# Patient Record
Sex: Male | Born: 1975 | Race: White | Hispanic: No | Marital: Married | Smoking: Never smoker
Health system: Southern US, Community
[De-identification: ages and names within clinical notes are randomized; demographics above are authoritative.]

## PROBLEM LIST (undated history)

## (undated) DIAGNOSIS — T8859XA Other complications of anesthesia, initial encounter: Secondary | ICD-10-CM

## (undated) DIAGNOSIS — I10 Essential (primary) hypertension: Secondary | ICD-10-CM

## (undated) HISTORY — PX: KNEE ARTHROSCOPY: SHX127

## (undated) HISTORY — PX: KNEE ARTHROSCOPY W/ ACL RECONSTRUCTION: SHX1858

---

## 2015-10-19 ENCOUNTER — Encounter: Payer: Self-pay | Admitting: *Deleted

## 2015-10-19 ENCOUNTER — Emergency Department
Admission: EM | Admit: 2015-10-19 | Discharge: 2015-10-19 | Disposition: A | Discharge status: Home / Self Care | Payer: BLUE CROSS/BLUE SHIELD | Source: Home / Self Care

## 2015-10-19 DIAGNOSIS — Z111 Encounter for screening for respiratory tuberculosis: Secondary | ICD-10-CM

## 2015-10-19 MED ORDER — TUBERCULIN PPD 5 UNIT/0.1ML ID SOLN
5.0000 [IU] | Freq: Once | INTRADERMAL | Status: DC
  Administered 2015-10-19: 5 [IU] via INTRADERMAL

## 2015-10-19 NOTE — ED Notes (Signed)
Pt is here for PPD placement. Placed to [facility]. Denies unexplained sweats, fever, SOB, cough or CP. Advised of when to return for PPD read. He may have PPD read elsewhere, I made a copy for him to take, kept original.

## 2015-10-21 ENCOUNTER — Emergency Department
Admission: EM | Admit: 2015-10-21 | Discharge: 2015-10-21 | Disposition: A | Discharge status: Home / Self Care | Payer: BLUE CROSS/BLUE SHIELD | Source: Home / Self Care

## 2015-10-21 LAB — READ PPD: TB SKIN TEST: NEGATIVE

## 2015-10-21 NOTE — ED Notes (Signed)
Pt is here for PPD reading placed in [facility] on 10/19/15. Negative for induration.

## 2015-10-27 ENCOUNTER — Encounter: Payer: Self-pay | Admitting: Family Medicine

## 2015-10-27 ENCOUNTER — Ambulatory Visit (INDEPENDENT_AMBULATORY_CARE_PROVIDER_SITE_OTHER): Payer: BLUE CROSS/BLUE SHIELD | Admitting: Family Medicine

## 2015-10-27 VITALS — BP 139/76 | HR 84 | Ht 68.0 in | Wt 174.0 lb

## 2015-10-27 DIAGNOSIS — G4726 Circadian rhythm sleep disorder, shift work type: Secondary | ICD-10-CM | POA: Diagnosis not present

## 2015-10-27 DIAGNOSIS — Z0000 Encounter for general adult medical examination without abnormal findings: Secondary | ICD-10-CM

## 2015-10-27 DIAGNOSIS — B360 Pityriasis versicolor: Secondary | ICD-10-CM | POA: Insufficient documentation

## 2015-10-27 DIAGNOSIS — IMO0001 Reserved for inherently not codable concepts without codable children: Secondary | ICD-10-CM | POA: Insufficient documentation

## 2015-10-27 MED ORDER — FLUCONAZOLE 100 MG PO TABS: 300.0000 mg | ORAL_TABLET | ORAL | Status: DC

## 2015-10-27 MED ORDER — MODAFINIL 200 MG PO TABS: 200.0000 mg | ORAL_TABLET | Freq: Every day | ORAL | Status: DC | PRN

## 2015-10-27 MED ORDER — KETOCONAZOLE 2 % EX CREA: 1.0000 "application " | TOPICAL_CREAM | Freq: Two times a day (BID) | CUTANEOUS | Status: DC

## 2015-10-27 NOTE — Patient Instructions (Signed)
Thank you for coming in today. Get fasting labs soon.  Take fluconazole 3 pills once weekly for 2 weeks and use ketoconazole cream  Tinea Versicolor Tinea versicolor is a common fungal infection of the skin. It causes a rash that appears as light or dark patches on the skin. The rash most often occurs on the chest, back, neck, or upper arms. This condition is more common during warm weather. Other than affecting how your skin looks, tinea versicolor usually does not cause other problems. In most cases, the infection goes away in a few weeks with treatment. It may take a few months for the patches on your skin to clear up. CAUSES Tinea versicolor occurs when a type of fungus that is normally present on the skin starts to overgrow. This fungus is a kind of yeast. The exact cause of the overgrowth is not known. This condition cannot be passed from one person to another (noncontagious). RISK FACTORS This condition is more likely to develop when certain factors are present, such as:  Heat and humidity.  Sweating too much.  Hormone changes.  Oily skin.  A weak defense (immune) system. SYMPTOMS Symptoms of this condition may include:  A rash on your skin that is made up of light or dark patches. The rash may have:  Patches of tan or pink spots on light skin.  Patches of white or brown spots on dark skin.  Patches of skin that do not tan.  Well-marked edges.  Scales on the discolored areas.  Mild itching. DIAGNOSIS A health care provider can usually diagnose this condition by looking at your skin. During the exam, he or she may use ultraviolet light to help determine the extent of the infection. In some cases, a skin sample may be taken by scraping the rash. This sample will be viewed under a microscope to check for yeast overgrowth. TREATMENT Treatment for this condition may include:  Dandruff shampoo that is applied to the affected skin during showers or  bathing.  Over-the-counter medicated skin cream, lotion, or soaps.  Prescription antifungal medicine in the form of skin cream or pills.  Medicine to help reduce itching. HOME CARE INSTRUCTIONS  Take medicines only as directed by your health care provider.  Apply dandruff shampoo to the affected area if told to do so by your health care provider. You may be instructed to scrub the affected skin for several minutes each day.  Do not scratch the affected area of skin.  Avoid hot and humid conditions.  Do not use tanning booths.  Try to avoid sweating a lot. SEEK MEDICAL CARE IF:  Your symptoms get worse.  You have a fever.  You have redness, swelling, or pain at the site of your rash.  You have fluid, blood, or pus coming from your rash.  Your rash returns after treatment.   This information is not intended to replace advice given to you by your health care provider. Make sure you discuss any questions you have with your health care provider.   Document Released: 04/21/2000 Document Revised: 05/15/2014 Document Reviewed: 02/03/2014 Elsevier Interactive Patient Education [employer]2016 Elsevier Inc.

## 2015-10-27 NOTE — Progress Notes (Signed)
Jesus Baker is a 40 y.o. 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   Patient is doing well and is healthy. He essentially has no chronic medical problems aside from occasional. Work sleep disorder and tinea versicolor. He takes several over-the-counter supplements and feels very well. He exercises regularly and tries to maintain a healthy lifestyle. He does not smoke or drink in excess or use smokeless tobacco. No fevers or chills.   Tinea versicolor: On back during the summer. Patient typically uses ketoconazole cream he would like a refill.  Shiftwork sleep disorder: Using modafinil occasionally. He would like a refill as well. This helped a great deal.  No past medical history on file. Past Surgical History  Procedure Laterality Date  . Knee arthroscopy w/ acl reconstruction    . Knee arthroscopy     Social History  Substance Use Topics  . Smoking status: Never Smoker   . Smokeless tobacco: Never Used  . Alcohol Use: 1.8 oz/week    3 Standard drinks or equivalent per week   family history includes Cancer in his maternal grandfather; Hyperlipidemia in his father; Hypertension in his mother; Stroke in his maternal grandfather.  ROS as above: No headache, visual changes, nausea, vomiting, diarrhea, constipation, dizziness, abdominal pain, , fevers, chills, night sweats, weight loss, swollen lymph nodes, body aches, joint swelling, muscle aches, chest pain, shortness of breath, mood changes, visual or auditory hallucinations.   Medications: Current Outpatient Prescriptions  Medication Sig Dispense Refill  . Cholecalciferol (VITAMIN D-3 PO) Take 1 capsule by mouth daily.    Marland Kitchen. MAGNESIUM PO Take by mouth.    . modafinil (PROVIGIL) 200 MG tablet Take 1 tablet (200 mg total) by mouth daily as  needed. 30 tablet 1  . Multiple Vitamin (MULTIVITAMIN) capsule Take 1 capsule by mouth daily.    . Nutritional Supplements (CALCIUM D-GLUCARATE PO) Take 1 capsule by mouth daily.    . Omega-3 Fatty Acids (FISH OIL) 1000 MG CAPS Take 1 capsule by mouth daily.    . sodium bicarbonate 325 MG tablet Take 325 mg by mouth daily.    . fluconazole (DIFLUCAN) 100 MG tablet Take 3 tablets (300 mg total) by mouth once a week. 6 tablet 0  . ketoconazole (NIZORAL) 2 % cream Apply 1 application topically 2 (two) times daily. To affected areas. 60 g 3   No current facility-administered medications for this visit.   Allergies  Allergen Reactions  . Poison Ivy Extract [employer][Extract Of Poison Ivy]   . Poison Oak Extract [employer][Extract Of Poison Oak]   . Sumac      Exam:  BP 139/76 mmHg  Pulse 84  Ht 5\' 8"  (1.727 m)  Wt 174 lb (78.926 kg)  BMI 26.46 kg/m2  SpO2 99% Gen: Well NAD HEENT: EOMI,  MMM Lungs: Normal work of breathing. CTABL Heart: RRR no MRG Abd: NABS, Soft. Nondistended, Nontender Exts: Brisk capillary refill, warm and well perfused.  Skin: Hypopigmented macular circular lesions on trunk   No results found for this or any previous visit (from the past 24 hour(s)). No results found.    Assessment and Plan: 40 y.o. male with  Well visit: Doing well. Up-to-date health maintenance. Check basic fasting labs.   maintain healthy lifestyle.  Prescribed fluconazole and topical ketoconazole for tinea versicolor. Refill modafinil  Recheck in 6-12 months.  Discussed warning signs or  symptoms. Please see discharge instructions. Patient expresses understanding.

## 2015-12-24 ENCOUNTER — Ambulatory Visit (INDEPENDENT_AMBULATORY_CARE_PROVIDER_SITE_OTHER): Payer: BLUE CROSS/BLUE SHIELD | Admitting: Family Medicine

## 2015-12-24 DIAGNOSIS — Z23 Encounter for immunization: Secondary | ICD-10-CM

## 2015-12-29 NOTE — Progress Notes (Signed)
Flu vaccine given.

## 2016-02-18 ENCOUNTER — Ambulatory Visit (INDEPENDENT_AMBULATORY_CARE_PROVIDER_SITE_OTHER): Payer: BLUE CROSS/BLUE SHIELD | Admitting: Family Medicine

## 2016-02-18 ENCOUNTER — Encounter: Payer: Self-pay | Admitting: Family Medicine

## 2016-02-18 DIAGNOSIS — M79632 Pain in left forearm: Secondary | ICD-10-CM | POA: Diagnosis not present

## 2016-02-18 NOTE — Patient Instructions (Addendum)
Thank you for coming in today. Do the exercises we discussed.  Modify lifting.  I am concerned about Radial Tunnel Syndrome.   Return in 1 month if not better.    Radial Tunnel Syndrome With Rehab Radial tunnel syndrome is a condition of the nervous system in which the radial nerve is compressed by surrounding structures in the elbow or forearm. Weakness in the hand and wrist characterizes the condition. The particular branch of the medial nerve that is usually affected (posterior interosseous branch) does not include sensory nerve cells; therefore, this condition does not usually involve severe pain or numbness. SYMPTOMS   Diffuse pain in the forearm and hand during activity.  Decreased grip and forearm strength.  Outer (lateral) elbow tenderness.  Pain that worsens when rotating the wrist (using a screwdriver or opening a door). CAUSES  An increased pressure placed on the radial nerve causes radial tunnel syndrome. The compression usually occurs in the elbow or forearm by muscles and ligament-like tissue known as the interosseous membrane. The condition may also be caused by direct trauma to the elbow or forearm. RISK INCREASES WITH:  Activities that involve repetitive and/or strenuous wrist and forearm movements (tennis or carpentry).  Contact sports (football, soccer, lacrosse or rugby).  Poor strength and flexibility  Failure to warm-up properly before activity.  Diabetes mellitus.  Underactive thyroid gland (hypothyroidism ). PREVENTION   Warm up and stretch properly before activity.  Maintain physical fitness:  Strength, flexibility, and endurance.  Cardiovascular fitness.  Wear properly fitted and padded protective equipment (elbow pads and slash guards). PROGNOSIS  If treated properly, then the symptoms of radial tunnel syndrome typically resolve. Rarely, surgery is necessary to free the compressed nerve.  RELATED COMPLICATIONS   Permanent nerve damage that  results in paralysis or weakness of the forearm and hand.  Prolonged healing time, if improperly treated or re-injured.  Prolonged disability (uncommon). TREATMENT  Treatment initially involves resting from any activities that aggravate the symptoms. Ice and medications may be used to help reduce pain and inflammation. The use of strengthening and stretching exercises may help reduce pain with activity. These exercises may be performed at home or with referral to a therapist. If there are signs of muscle wasting (atrophy) or symptoms persist for greater than 6 months despite conservative (non-surgical) treatment, then surgery may be recommended. MEDICATION   If pain medication is necessary, then nonsteroidal anti-inflammatory medications, such as aspirin and ibuprofen, or other minor pain relievers, such as acetaminophen, are often recommended.  Do not take pain medication for 7 days before surgery.  Prescription pain relievers may be given if deemed necessary by your caregiver. Use only as directed and only as much as you need. HEAT AND COLD  Cold treatment (icing) relieves pain and reduces inflammation. Cold treatment should be applied for 10 to 15 minutes every 2 to 3 hours for inflammation and pain and immediately after any activity that aggravates your symptoms. Use ice packs or massage the area with a piece of ice (ice massage).  Heat treatment may be used prior to performing the stretching and strengthening activities prescribed by your caregiver, physical therapist, or athletic trainer. Use a heat pack or soak the injury in warm water. SEEK MEDICAL CARE IF:  Treatment seems to offer no benefit, or the condition worsens.  Any medications produce adverse side effects.  Any complications from surgery occur:  Pain, numbness, or coldness in the extremity operated upon.  Discoloration of the nail beds (they become blue  or gray) of the extremity operated upon.  Signs of infections  (fever, pain, inflammation, redness, or persistent bleeding). EXERCISES  RANGE OF MOTION (ROM) AND STRETCHING EXERCISES - Radial Tunnel Syndrome (Radial [Posterior Interosseous] Nerve) These exercises may help you when beginning to rehabilitate your injury. Your symptoms may resolve with or without further involvement from your physician, physical therapist or athletic trainer. While completing these exercises, remember:   Restoring tissue flexibility helps normal motion to return to the joints. This allows healthier, less painful movement and activity.  An effective stretch should be held for at least 30 seconds.  A stretch should never be painful. You should only feel a gentle lengthening or release in the stretched tissue. RANGE OF MOTION - Wrist Flexion, Active-Assisted  Extend your right / left elbow with your fingers pointing down.*  Gently pull the back of your hand towards you until you feel a gentle stretch on the top of your forearm.  Hold this position for __________ seconds. Repeat __________ times. Complete this exercise __________ times per day.  *If directed by your physician, physical therapist or athletic trainer, complete this stretch with your elbow bent rather than extended. STRETCH - Wrist Flexion  Place the back of your right / left hand on a tabletop leaving your elbow slightly bent. Your fingers should point away from your body.  Gently press the back of your hand down onto the table by straightening your elbow. You should feel a stretch on the top of your forearm.  Hold this position for __________ seconds. Repeat __________ times. Complete this stretch __________ times per day.  STRENGTHENING EXERCISES - Radial Tunnel Syndrome (Radial [Posterior Interosseous] Nerve) These exercises may help you when beginning to rehabilitate your injury. They may resolve your symptoms with or without further involvement from your physician, physical therapist or athletic trainer.  While completing these exercises, remember:   Muscles can gain both the endurance and the strength needed for everyday activities through controlled exercises.  Complete these exercises as instructed by your physician, physical therapist or athletic trainer. Progress the resistance and repetitions only as guided. STRENGTH - Wrist Extensors  Sit with your right / left forearm palm-down and fully supported. Your elbow should be resting below the height of your shoulder. Allow your wrist to extend over the edge of the surface.  Loosely holding a __________ weight or a piece of rubber exercise band/tubing, slowly curl your hand up toward your forearm.  Hold this position for __________ seconds. Slowly lower the wrist back to the starting position in a controlled manner. Repeat __________ times. Complete this exercise __________ times per day.  STRENGTH - Radial Deviators  Stand with a ____________________ weight in your right / left hand, or sit holding on to the rubber exercise band/tubing with your arm supported.  Raise your hand upward in front of you or pull up on the rubber tubing.  Hold this position for __________ seconds and then slowly lower the wrist back to the starting position. Repeat __________ times. Complete this exercise __________ times per day. STRENGTH - Grip   Grasp a tennis ball, a dense sponge, or a large, rolled sock in your hand.  Squeeze as hard as you can without increasing any pain.  Hold this position for __________ seconds. Release your grip slowly. Repeat __________ times. Complete this exercise __________ times per day.    This information is not intended to replace advice given to you by your health care provider. Make sure you discuss any  questions you have with your health care provider.   Document Released: 04/24/2005 Document Revised: 09/08/2014 Document Reviewed: 08/06/2008 Elsevier Interactive Patient Education [employer] Elsevier Inc.

## 2016-02-18 NOTE — Progress Notes (Signed)
   Jesus Baker is a 40 y.o. male who presents to [facility] today for left forearm pain. Patient has a several month history of intermittent mild left forearm pain. Pain is located in the lateral forearm worst with grip and wrist extension. He denies any elbow pain radiating pain weakness or numbness. He denies any injury. The pain is mild at rest but worsens with activity. He notes he's had a cut back on weightlifting. Additionally he has some pain when he takes his daughter up. No fevers or chills. No treatment tried yet.   No past medical history on file. Past Surgical History:  Procedure Laterality Date  . KNEE ARTHROSCOPY    . KNEE ARTHROSCOPY W/ ACL RECONSTRUCTION     Social History  Substance Use Topics  . Smoking status: Never Smoker  . Smokeless tobacco: Never Used  . Alcohol use 1.8 oz/week    3 Standard drinks or equivalent per week     ROS:  As above   Medications: Current Outpatient Prescriptions  Medication Sig Dispense Refill  . Cholecalciferol (VITAMIN D-3 PO) Take 1 capsule by mouth daily.    Marland Kitchen. ketoconazole (NIZORAL) 2 % cream Apply 1 application topically 2 (two) times daily. To affected areas. 60 g 3  . MAGNESIUM PO Take by mouth.    . modafinil (PROVIGIL) 200 MG tablet Take 1 tablet (200 mg total) by mouth daily as needed. 30 tablet 1  . Multiple Vitamin (MULTIVITAMIN) capsule Take 1 capsule by mouth daily.    . Nutritional Supplements (CALCIUM D-GLUCARATE PO) Take 1 capsule by mouth daily.    . Omega-3 Fatty Acids (FISH OIL) 1000 MG CAPS Take 1 capsule by mouth daily.    . sodium bicarbonate 325 MG tablet Take 325 mg by mouth daily.    . fluconazole (DIFLUCAN) 100 MG tablet Take 3 tablets (300 mg total) by mouth once a week. (Patient not taking: Reported on 02/18/2016) 6 tablet 0   No current facility-administered medications for this visit.    Allergies  Allergen Reactions  . Poison Ivy Extract [Poison Ivy  Extract]   . Poison Oak Extract [employer][Poison Oak Extract]   . Sumac      Exam:  BP (!) 128/92   Pulse 63   Wt 175 lb (79.4 kg)   BMI 26.61 kg/m  General: Well Developed, well nourished, and in no acute distress.  Neuro/Psych: Alert and oriented x3, extra-ocular muscles intact, able to move all 4 extremities, sensation grossly intact. Skin: Warm and dry, no rashes noted.  Respiratory: Not using accessory muscles, speaking in full sentences, trachea midline.  Cardiovascular: Pulses palpable, no extremity edema. Abdomen: Does not appear distended. MSK: Left forearm is normal-appearing. The lateral epicondyle is nontender. The anterior lateral forearm is mildly tender. Intact wrist and finger extension and flexion strength. Pulses capillary refill and sensation are intact. Elbow flexion and extension wrist flexion and extension and supination and pronation are normal.    No results found for this or any previous visit (from the past 48 hour(s)). No results found.    Assessment and Plan: 40 y.o. 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 Plan for modification of lifting and eccentric wrist exercises. If no better in one month will return for ultrasound guided injection of the arcade de frohse.   No orders of the defined types were placed in this encounter.   Discussed warning signs or symptoms. Please see discharge instructions. Patient expresses understanding.

## 2016-03-17 ENCOUNTER — Encounter: Payer: Self-pay | Admitting: Family Medicine

## 2016-03-17 ENCOUNTER — Ambulatory Visit (INDEPENDENT_AMBULATORY_CARE_PROVIDER_SITE_OTHER): Payer: BLUE CROSS/BLUE SHIELD | Admitting: Family Medicine

## 2016-03-17 VITALS — BP 144/80 | HR 60 | Ht 68.0 in | Wt 170.0 lb

## 2016-03-17 DIAGNOSIS — M79632 Pain in left forearm: Secondary | ICD-10-CM | POA: Diagnosis not present

## 2016-03-17 NOTE — Progress Notes (Signed)
   Jesus Baker is a 40 y.o. male who [facility] today for follow-up left forearm pain. Patient was seen a month ago for left forearm pain thought to be radial tunnel syndrome. He's tried modifying his activities doesn't feel any better. He continues to note diffuse soreness in his left forearm but denies any weakness or numbness or loss of function. No fevers or chills.   No past medical history on file. Past Surgical History:  Procedure Laterality Date  . KNEE ARTHROSCOPY    . KNEE ARTHROSCOPY W/ ACL RECONSTRUCTION     Social History  Substance Use Topics  . Smoking status: Never Smoker  . Smokeless tobacco: Never Used  . Alcohol use 1.8 oz/week    3 Standard drinks or equivalent per week     ROS:  As above   Medications: Current Outpatient Prescriptions  Medication Sig Dispense Refill  . Cholecalciferol (VITAMIN D-3 PO) Take 1 capsule by mouth daily.    Marland Kitchen. MAGNESIUM PO Take by mouth.    . modafinil (PROVIGIL) 200 MG tablet Take 1 tablet (200 mg total) by mouth daily as needed. 30 tablet 1  . Multiple Vitamin (MULTIVITAMIN) capsule Take 1 capsule by mouth daily.    . Nutritional Supplements (CALCIUM D-GLUCARATE PO) Take 1 capsule by mouth daily.    . Omega-3 Fatty Acids (FISH OIL) 1000 MG CAPS Take 1 capsule by mouth daily.    . sodium bicarbonate 325 MG tablet Take 325 mg by mouth daily.     No current facility-administered medications for this visit.    Allergies  Allergen Reactions  . Poison Ivy Extract [Poison Ivy Extract]   . Poison Oak Extract [employer][Poison Oak Extract]   . Sumac      Exam:  BP (!) 144/80   Pulse 60   Ht 5\' 8"  (1.727 m)   Wt 170 lb (77.1 kg)   BMI 25.85 kg/m  General: Well Developed, well nourished, and in no acute distress.  Neuro/Psych: Alert and oriented x3, extra-ocular muscles intact, able to move all 4 extremities, sensation grossly intact. Skin: Warm and dry, no rashes noted.    Respiratory: Not using accessory muscles, speaking in full sentences, trachea midline.  Cardiovascular: Pulses palpable, no extremity edema. Abdomen: Does not appear distended. MSK: Left arm is well-appearing. Minimally tender left lateral forearm. Normal elbow and wrist motion. Intact sensation strength pulses capillary refill. Reproducible with resisted supination.  Limited musculoskeletal ultrasound of the radial nerve near the arcade de frohse shows the radial nerve slightly enlarged at 0.23 cm cross-sectional area. Pain is somewhat reproducible with compression of this area. Normal appearance of bony structures.   No results found for this or any previous visit (from the past 48 hour(s)). No results found.    Assessment and Plan: 40 y.o. 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 Plan to treat with formal hand therapy. If not better would consider injection. Recheck in 1-2 months.    Orders Placed This Encounter  Procedures  . Ambulatory referral to Physical Therapy    Referral Priority:   Routine    Referral Type:   Physical Medicine    Referral Reason:   Specialty Services Required    Requested Specialty:   Physical Therapy    Number of Visits Requested:   1    Discussed warning signs or symptoms. Please see discharge instructions. Patient expresses understanding.

## 2016-03-17 NOTE — Patient Instructions (Signed)
Thank you for coming in today. Attend Hand PT.  Recheck in 1-2 months. } Return sooner if needed.   Let me know if you worsen.    Radial Tunnel Syndrome With Rehab Radial tunnel syndrome is a condition of the nervous system in which the radial nerve is compressed by surrounding structures in the elbow or forearm. Weakness in the hand and wrist characterizes the condition. The particular branch of the medial nerve that is usually affected (posterior interosseous branch) does not include sensory nerve cells; therefore, this condition does not usually involve severe pain or numbness. SYMPTOMS   Diffuse pain in the forearm and hand during activity.  Decreased grip and forearm strength.  Outer (lateral) elbow tenderness.  Pain that worsens when rotating the wrist (using a screwdriver or opening a door). CAUSES  An increased pressure placed on the radial nerve causes radial tunnel syndrome. The compression usually occurs in the elbow or forearm by muscles and ligament-like tissue known as the interosseous membrane. The condition may also be caused by direct trauma to the elbow or forearm. RISK INCREASES WITH:  Activities that involve repetitive and/or strenuous wrist and forearm movements (tennis or carpentry).  Contact sports (football, soccer, lacrosse or rugby).  Poor strength and flexibility  Failure to warm-up properly before activity.  Diabetes mellitus.  Underactive thyroid gland (hypothyroidism ). PREVENTION   Warm up and stretch properly before activity.  Maintain physical fitness:  Strength, flexibility, and endurance.  Cardiovascular fitness.  Wear properly fitted and padded protective equipment (elbow pads and slash guards). PROGNOSIS  If treated properly, then the symptoms of radial tunnel syndrome typically resolve. Rarely, surgery is necessary to free the compressed nerve.  RELATED COMPLICATIONS   Permanent nerve damage that results in paralysis or weakness  of the forearm and hand.  Prolonged healing time, if improperly treated or re-injured.  Prolonged disability (uncommon). TREATMENT  Treatment initially involves resting from any activities that aggravate the symptoms. Ice and medications may be used to help reduce pain and inflammation. The use of strengthening and stretching exercises may help reduce pain with activity. These exercises may be performed at home or with referral to a therapist. If there are signs of muscle wasting (atrophy) or symptoms persist for greater than 6 months despite conservative (non-surgical) treatment, then surgery may be recommended. MEDICATION   If pain medication is necessary, then nonsteroidal anti-inflammatory medications, such as aspirin and ibuprofen, or other minor pain relievers, such as acetaminophen, are often recommended.  Do not take pain medication for 7 days before surgery.  Prescription pain relievers may be given if deemed necessary by your caregiver. Use only as directed and only as much as you need. HEAT AND COLD  Cold treatment (icing) relieves pain and reduces inflammation. Cold treatment should be applied for 10 to 15 minutes every 2 to 3 hours for inflammation and pain and immediately after any activity that aggravates your symptoms. Use ice packs or massage the area with a piece of ice (ice massage).  Heat treatment may be used prior to performing the stretching and strengthening activities prescribed by your caregiver, physical therapist, or athletic trainer. Use a heat pack or soak the injury in warm water. SEEK MEDICAL CARE IF:  Treatment seems to offer no benefit, or the condition worsens.  Any medications produce adverse side effects.  Any complications from surgery occur:  Pain, numbness, or coldness in the extremity operated upon.  Discoloration of the nail beds (they become blue or gray) of the  extremity operated upon.  Signs of infections (fever, pain, inflammation, redness,  or persistent bleeding). EXERCISES  RANGE OF MOTION (ROM) AND STRETCHING EXERCISES - Radial Tunnel Syndrome (Radial [Posterior Interosseous] Nerve) These exercises may help you when beginning to rehabilitate your injury. Your symptoms may resolve with or without further involvement from your physician, physical therapist or athletic trainer. While completing these exercises, remember:   Restoring tissue flexibility helps normal motion to return to the joints. This allows healthier, less painful movement and activity.  An effective stretch should be held for at least 30 seconds.  A stretch should never be painful. You should only feel a gentle lengthening or release in the stretched tissue. RANGE OF MOTION - Wrist Flexion, Active-Assisted  Extend your right / left elbow with your fingers pointing down.*  Gently pull the back of your hand towards you until you feel a gentle stretch on the top of your forearm.  Hold this position for __________ seconds. Repeat __________ times. Complete this exercise __________ times per day.  *If directed by your physician, physical therapist or athletic trainer, complete this stretch with your elbow bent rather than extended. STRETCH - Wrist Flexion  Place the back of your right / left hand on a tabletop leaving your elbow slightly bent. Your fingers should point away from your body.  Gently press the back of your hand down onto the table by straightening your elbow. You should feel a stretch on the top of your forearm.  Hold this position for __________ seconds. Repeat __________ times. Complete this stretch __________ times per day.  STRENGTHENING EXERCISES - Radial Tunnel Syndrome (Radial [Posterior Interosseous] Nerve) These exercises may help you when beginning to rehabilitate your injury. They may resolve your symptoms with or without further involvement from your physician, physical therapist or athletic trainer. While completing these exercises,  remember:   Muscles can gain both the endurance and the strength needed for everyday activities through controlled exercises.  Complete these exercises as instructed by your physician, physical therapist or athletic trainer. Progress the resistance and repetitions only as guided. STRENGTH - Wrist Extensors  Sit with your right / left forearm palm-down and fully supported. Your elbow should be resting below the height of your shoulder. Allow your wrist to extend over the edge of the surface.  Loosely holding a __________ weight or a piece of rubber exercise band/tubing, slowly curl your hand up toward your forearm.  Hold this position for __________ seconds. Slowly lower the wrist back to the starting position in a controlled manner. Repeat __________ times. Complete this exercise __________ times per day.  STRENGTH - Radial Deviators  Stand with a ____________________ weight in your right / left hand, or sit holding on to the rubber exercise band/tubing with your arm supported.  Raise your hand upward in front of you or pull up on the rubber tubing.  Hold this position for __________ seconds and then slowly lower the wrist back to the starting position. Repeat __________ times. Complete this exercise __________ times per day. STRENGTH - Grip   Grasp a tennis ball, a dense sponge, or a large, rolled sock in your hand.  Squeeze as hard as you can without increasing any pain.  Hold this position for __________ seconds. Release your grip slowly. Repeat __________ times. Complete this exercise __________ times per day.    This information is not intended to replace advice given to you by your health care provider. Make sure you discuss any questions you have with  your health care provider.   Document Released: 04/24/2005 Document Revised: 09/08/2014 Document Reviewed: 08/06/2008 Elsevier Interactive Patient Education [employer]2016 Elsevier Inc.

## 2016-04-03 ENCOUNTER — Ambulatory Visit (INDEPENDENT_AMBULATORY_CARE_PROVIDER_SITE_OTHER): Payer: BLUE CROSS/BLUE SHIELD | Admitting: Physical Therapy

## 2016-04-03 ENCOUNTER — Encounter: Payer: Self-pay | Admitting: Physical Therapy

## 2016-04-03 DIAGNOSIS — M25522 Pain in left elbow: Secondary | ICD-10-CM | POA: Diagnosis not present

## 2016-04-03 DIAGNOSIS — M25622 Stiffness of left elbow, not elsewhere classified: Secondary | ICD-10-CM

## 2016-04-03 DIAGNOSIS — M6281 Muscle weakness (generalized): Secondary | ICD-10-CM | POA: Diagnosis not present

## 2016-04-03 NOTE — Therapy (Signed)
[facility] [address] [place], [place], [address] Phone: [phone]   Fax:  [phone]  Physical Therapy Evaluation  Patient Details  Name: Jesus Baker MRN: [id number] Date of Birth: 1975/10/27 Referring Provider: Dr Teressa Lower  Encounter Date: 04/03/2016      PT End of Session - 04/03/16 1058    Visit Number 1   Number of Visits 6   Date for PT Re-Evaluation 04/24/16   PT Start Time 1058   PT Stop Time 1147   PT Time Calculation (min) 49 min   Activity Tolerance Patient tolerated treatment well      History reviewed. No pertinent past medical history.  Past Surgical History:  Procedure Laterality Date  . KNEE ARTHROSCOPY    . KNEE ARTHROSCOPY W/ ACL RECONSTRUCTION      There were no vitals filed for this visit.       Subjective Assessment - 04/03/16 1058    Subjective Pt reports he noticed Lt forearm pain about 3 months ago that has been getting worse.  Supination of the forearm and holding his daughter increase the pain. He has attempted to work on his grip strength, stretches for forearm.    Diagnostic tests dx ultrasound - nothing clear.    Patient Stated Goals unable to perform bicep curls, make the symptoms go away.    Currently in Pain? No/denies  positional dependent.             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      Observation/Other  Assessments   Focus on Therapeutic Outcomes (FOTO)  37% limited     Posture/Postural Control   Posture/Postural Control Postural limitations  bilat WNL   Postural Limitations Rounded Shoulders;Forward head  Rt scapula closer to spine, 1" Lt 3"      ROM / Strength   AROM / PROM / Strength AROM;Strength     AROM   Overall AROM Comments cervical WNL   AROM Assessment Site Shoulder;Elbow;Forearm;Wrist;Finger   Right/Left Shoulder --  bilat WNL   Right/Left Elbow --  bilat WNL   Right/Left Forearm --  bilat WNL   Right/Left Wrist --  bilat WNl   Right/Left Finger --  bilat WNL     Strength   Strength Assessment Site Shoulder;Elbow;Forearm;Wrist;Hand   Right/Left Shoulder --  bilat WNL   Right/Left Elbow Left  Rt WNL   Left Elbow Flexion --  5-/5   Left Elbow Extension 4+/5   Right/Left Forearm --  bilat WNL    Right/Left Wrist Left  Rt WNL   Left Wrist Flexion 4+/5   Left Wrist Extension 4/5   Left Wrist Radial Deviation 5/5   Left Wrist Ulnar Deviation 5/5   Right/Left hand Right;Left   Right Hand Grip (lbs) 99  Right Hand Lateral Pinch 15 lbs   Left Hand Grip (lbs) 97  decreasing with each attempt   Left Hand 3 Point Pinch 15 lbs     Palpation   Palpation comment tightness in Lt forearm , wrist extensors and pronators.                    [facility] Adult PT Treatment/Exercise - 04/03/16 0001      Modalities   Modalities Electrical Stimulation;Moist Heat     Moist Heat Therapy   Number Minutes Moist Heat 15 Minutes   Moist Heat Location --  Lt forearm     Electrical Stimulation   Electrical Stimulation Location Lt forearm   Electrical Stimulation Action IFC    Electrical Stimulation Parameters to tolerance   Electrical Stimulation Goals Pain;Tone     Manual Therapy   Manual Therapy Soft tissue mobilization   Soft tissue mobilization Lt forearm STW,  pt became nauseous with TDN, given cold compress and placed supine and felt better.            Trigger Point Dry Needling - 04/03/16 1135    Consent Given? Yes   Education Handout Provided Yes   Muscles Treated Upper Body --  exten carpi ulnaris, digitorum Lt - god twitch response              PT Education - 04/03/16 1126    Education provided Yes   Education Details TDN and continue with current ther ex from MD    Person(s) Educated Patient   Methods Handout;Explanation   Comprehension Verbalized understanding             PT Long Term Goals - 04/03/16 1311      PT LONG TERM GOAL #1   Title I with advanced HEP per his previous work out routine ( 04/24/16)    Time 3   Period Weeks   Status New     PT LONG TERM GOAL #2   Title demo Lt elbow strength =/> Rt ( 04/24/16)    Time 3   Period Weeks   Status New     PT LONG TERM GOAL #3   Title report ability to carry his child and talk on his cell phone without Lt forearm pain ( 04/24/16)    Time 3   Period Weeks   Status New     PT LONG TERM GOAL #4   Title improve FOTO =/< 22% limited ( 04/24/16)    Time 3   Period Weeks   Status New               Plan - 04/03/16 1308    Clinical Impression Statement 40 yo male presents with insideous onset of Lt forearm pain about 3 months ago.  It is interfering with his ability to participate liesure activities, and in some of his job activities.  He has great ROM in the upper body, some postural changes, weakness in the Lt elbow and many trigger points in the forearm.    Rehab Potential Excellent   PT Frequency 2x / week   PT Duration 3 weeks   PT Treatment/Interventions Moist Heat;Ultrasound;Therapeutic exercise;Dry needling;Taping;Manual techniques;Cryotherapy;Electrical Stimulation;Patient/family education;Passive range of motion   PT Next Visit Plan assess response to TDN and continue this in the forearm along with manual STW.  Perform supine due to reaction from last session.    Consulted and Agree with Plan of Care Patient      Patient  will  benefit from skilled therapeutic intervention in order to improve the following deficits and impairments:  Postural dysfunction, Pain, Decreased strength, Impaired UE functional use, Increased muscle spasms  Visit Diagnosis: Stiffness of left elbow, not elsewhere classified - Plan: PT plan of care cert/re-cert  Muscle weakness (generalized) - Plan: PT plan of care cert/re-cert  Pain in left elbow - Plan: PT plan of care cert/re-cert     Problem List Patient Active Problem List   Diagnosis Date Noted  . Left forearm pain 02/18/2016  . Tinea versicolor 10/27/2015  . Well adult 10/27/2015  . 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 [place], [place], [address] Phone: [phone]   Fax:  [phone]  Name: Jesus Baker MRN: [id number] Date of Birth: [birth year]

## 2016-04-03 NOTE — Patient Instructions (Signed)

## 2016-04-10 ENCOUNTER — Ambulatory Visit (INDEPENDENT_AMBULATORY_CARE_PROVIDER_SITE_OTHER): Payer: BLUE CROSS/BLUE SHIELD | Admitting: Physical Therapy

## 2016-04-10 ENCOUNTER — Encounter: Payer: Self-pay | Admitting: Physical Therapy

## 2016-04-10 DIAGNOSIS — M25522 Pain in left elbow: Secondary | ICD-10-CM

## 2016-04-10 DIAGNOSIS — M25622 Stiffness of left elbow, not elsewhere classified: Secondary | ICD-10-CM | POA: Diagnosis not present

## 2016-04-10 DIAGNOSIS — M6281 Muscle weakness (generalized): Secondary | ICD-10-CM

## 2016-04-10 NOTE — Therapy (Signed)
[facility] [address] [place] [address] [place], [place], [address] Phone: [phone]   Fax:  [phone]  Physical Therapy Treatment  Patient Details  Name: Jesus Baker MRN: [id number] Date of Birth: September 04, 1975 Referring Provider: Dr Teressa LowerE Corey  Encounter Date: 04/10/2016      PT End of Session - 04/10/16 1415    Visit Number 2   Number of Visits 6   Date for PT Re-Evaluation 04/24/16   PT Start Time 0801   PT Stop Time 0843   PT Time Calculation (min) 42 min   Activity Tolerance Patient tolerated treatment well   Behavior During Therapy [facility] for tasks assessed/performed      History reviewed. No pertinent past medical history.  Past Surgical History:  Procedure Laterality Date  . KNEE ARTHROSCOPY    . KNEE ARTHROSCOPY W/ ACL RECONSTRUCTION      There were no vitals filed for this visit.      Subjective Assessment - 04/10/16 0803    Subjective Maybe a little better but not much change after last session. Still having trouble holding daughter. Tried to lift some mulch at home yesterday and has pain in the biceps.    Currently in Pain? No/denies                         [facility] Adult PT Treatment/Exercise - 04/10/16 0001      Modalities   Modalities Cryotherapy     Cryotherapy   Number Minutes Cryotherapy 10 Minutes   Cryotherapy Location Forearm  LT   Type of Cryotherapy Ice pack     Manual Therapy   Manual Therapy Soft tissue mobilization;Joint mobilization   Manual therapy comments multiple trigger points localized during session.,    Joint Mobilization radial head A<>P grade 2-3   Soft tissue mobilization STM/TPR in region of Lt brachioradialis, supinator, extensor carpi radialis.                 PT Education - 04/10/16 1414    Education provided Yes   Education Details Educated on location of trigger pts and how to perform self TPR.    Person(s) Educated Patient   Methods  Explanation;Demonstration;Tactile cues;Verbal cues   Comprehension Verbalized understanding             PT Long Term Goals - 04/10/16 1421      PT LONG TERM GOAL #1   Title I with advanced HEP per his previous work out routine ( 04/24/16)    Time 3   Period Weeks   Status On-going     PT LONG TERM GOAL #2   Title demo Lt elbow strength =/> Rt ( 04/24/16)    Time 3   Period Weeks   Status On-going     PT LONG TERM GOAL #3   Title report ability to carry his child and talk on his cell phone without Lt forearm pain ( 04/24/16)    Time 3   Period Weeks   Status On-going     PT LONG TERM GOAL #4   Title improve FOTO =/< 22% limited ( 04/24/16)    Time 3   Period Weeks   Status On-going               Plan - 04/10/16 1417    Clinical Impression Statement Multiple trigger points localized during session, with and without reporduction of his primary symptoms. Pt educated on technique for self TPR  and areas of primary concern. Following session pt denied any questions or concerns. Pt admitting that he had more trigger points than he had anticipated. Pt remains appropriate for continued OPPT services.    Rehab Potential Excellent   PT Frequency 2x / week   PT Duration 3 weeks   PT Treatment/Interventions Moist Heat;Ultrasound;Therapeutic exercise;Dry needling;Taping;Manual techniques;Cryotherapy;Electrical Stimulation;Patient/family education;Passive range of motion   PT Next Visit Plan Continue with TDN as able, manual techniques as appropriate.    Consulted and Agree with Plan of Care Patient      Patient will benefit from skilled therapeutic intervention in order to improve the following deficits and impairments:  Postural dysfunction, Pain, Decreased strength, Impaired UE functional use, Increased muscle spasms  Visit Diagnosis: Stiffness of left elbow, not elsewhere classified  Muscle weakness (generalized)  Pain in left elbow     Problem List Patient  Active Problem List   Diagnosis Date Noted  . Left forearm pain 02/18/2016  . Tinea versicolor 10/27/2015  . Well adult 10/27/2015  . 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

## 2016-04-17 ENCOUNTER — Ambulatory Visit (INDEPENDENT_AMBULATORY_CARE_PROVIDER_SITE_OTHER): Payer: BLUE CROSS/BLUE SHIELD | Admitting: Physical Therapy

## 2016-04-17 DIAGNOSIS — M25622 Stiffness of left elbow, not elsewhere classified: Secondary | ICD-10-CM

## 2016-04-17 DIAGNOSIS — M25522 Pain in left elbow: Secondary | ICD-10-CM | POA: Diagnosis not present

## 2016-04-17 DIAGNOSIS — M6281 Muscle weakness (generalized): Secondary | ICD-10-CM | POA: Diagnosis not present

## 2016-04-17 NOTE — Therapy (Signed)
[facility] [address] [place] [address] [place], [place], [address] Phone: [phone]   Fax:  [phone]  Physical Therapy Treatment  Patient Details  Name: Jesus Baker MRN: [id number] Date of Birth: May 11, 1975 Referring Provider: Dr Teressa LowerE Corey  Encounter Date: 04/17/2016      PT End of Session - 04/17/16 1150    Visit Number 3   Number of Visits 6   Date for PT Re-Evaluation 04/24/16   PT Start Time 1151   PT Stop Time 1249   PT Time Calculation (min) 58 min   Activity Tolerance Patient tolerated treatment well      No past medical history on file.  Past Surgical History:  Procedure Laterality Date  . KNEE ARTHROSCOPY    . KNEE ARTHROSCOPY W/ ACL RECONSTRUCTION      There were no vitals filed for this visit.      Subjective Assessment - 04/17/16 1153    Subjective Pt reports that the spot where Ben worked was sore for about 18 hrs then it started to feel better.    Currently in Pain? No/denies  feels discomfort when he picks things up, thinks it is a little better.                          [facility] Adult PT Treatment/Exercise - 04/17/16 0001      Exercises   Exercises Elbow     Elbow Exercises   Forearm Supination Left  30 reps, 3# weight focus eccentric   Wrist Flexion Left  30 reps, eccentric focus   Other elbow exercises radial deviation, 3# focus eccentric   Other elbow exercises bicep brachalis 3#, eccentric focus     Modalities   Modalities Moist Heat;Ultrasound     Moist Heat Therapy   Number Minutes Moist Heat 15 Minutes   Moist Heat Location --  Lt forearm     Ultrasound   Ultrasound Location Lt forearm   Ultrasound Parameters 50%, 1.390mHz, 1.0w/cm2   Ultrasound Goals Pain  tightness     Manual Therapy   Manual Therapy Soft tissue mobilization   Manual therapy comments multiple trigger points localized during session.,   IASTM to Lt forearm   Soft tissue mobilization STM/TPR in  region of Lt brachioradialis, supinator, extensor carpi radialis.           Trigger Point Dry Needling - 04/17/16 1239    Consent Given? Yes   Education Handout Provided No   Muscles Treated Upper Body --  bicep brachialis, supinators                    PT Long Term Goals - 04/17/16 1245      PT LONG TERM GOAL #1   Title I with advanced HEP per his previous work out routine ( 04/24/16)    Status On-going     PT LONG TERM GOAL #2   Title demo Lt elbow strength =/> Rt ( 04/24/16)    Status On-going     PT LONG TERM GOAL #3   Title report ability to carry his child and talk on his cell phone without Lt forearm pain ( 04/24/16)    Status On-going     PT LONG TERM GOAL #4   Title improve FOTO =/< 22% limited ( 04/24/16)    Status On-going               Plan - 04/17/16 1245  Clinical Impression Statement Lyric continues with some trigger points in his Lt forearm, responded well to IASTM and TDN. He thinks he may be having some improvement however still has pain with lifitng and using the arm.    Rehab Potential Excellent   PT Frequency 2x / week   PT Duration 3 weeks   PT Treatment/Interventions Moist Heat;Ultrasound;Therapeutic exercise;Dry needling;Taping;Manual techniques;Cryotherapy;Electrical Stimulation;Patient/family education;Passive range of motion   PT Next Visit Plan Continue with TDN as able, manual techniques as appropriate.    Consulted and Agree with Plan of Care Patient      Patient will benefit from skilled therapeutic intervention in order to improve the following deficits and impairments:  Postural dysfunction, Pain, Decreased strength, Impaired UE functional use, Increased muscle spasms  Visit Diagnosis: Pain in left elbow  Muscle weakness (generalized)  Stiffness of left elbow, not elsewhere classified     Problem List Patient Active Problem List   Diagnosis Date Noted  . Left forearm pain 02/18/2016  . Tinea versicolor  10/27/2015  . Well adult 10/27/2015  . 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

## 2016-04-17 NOTE — Patient Instructions (Addendum)
Wrist Extension: Resisted    With left palm down, __3-5__ pound weight in hand, bend wrist up. Return slowly, count of 6. Repeat _30___ times per set. Do _1___ sets per session. Do _1___ sessions per day.  Wrist Radial Deviation: Resisted (Standing)    With left arm at side, thumb forward, __3-5__ pound weight in hand, bend wrist back. Return slowly. Repeat __30__ times per set. Do _1___ sets per session. Do __1__ sessions per day.  Forearm Pronation / Supination: Resisted (Sitting) - do holding a weight   With left forearm supported, grasp object 3-5# wt  and gently rotate palm up, then down, as far as possible without pain. Repeat __30_ times per set. Do __1__ sets per session. Do __1__ sessions per day.   Elbow Flexion: Resisted    With left arm straight, thumb forward, Holding __3-5__ pound weight, bend elbow. Return slowly, Count of 6. Repeat __30__ times per set. Do __1__ sets per session. Do __1__ sessions per day.  Copyright  VHI. All rights reserved.

## 2016-04-24 ENCOUNTER — Ambulatory Visit (INDEPENDENT_AMBULATORY_CARE_PROVIDER_SITE_OTHER): Payer: BLUE CROSS/BLUE SHIELD | Admitting: Physical Therapy

## 2016-04-24 DIAGNOSIS — M6281 Muscle weakness (generalized): Secondary | ICD-10-CM | POA: Diagnosis not present

## 2016-04-24 DIAGNOSIS — M25522 Pain in left elbow: Secondary | ICD-10-CM | POA: Diagnosis not present

## 2016-04-24 DIAGNOSIS — M25622 Stiffness of left elbow, not elsewhere classified: Secondary | ICD-10-CM | POA: Diagnosis not present

## 2016-04-24 NOTE — Therapy (Signed)
[place] [address] [place], [place], [address] Phone: [phone]   Fax:  [phone]  Physical Therapy Treatment  Patient Details  Name: Jesus Baker MRN: [id number] Date of Birth: [birth year] Referring Provider: Dr. Steva Colder   Encounter Date: 04/24/2016      PT End of Session - 04/24/16 0805    Visit Number 4   Number of Visits 6   Date for PT Re-Evaluation 04/24/16   PT Start Time 0713   PT Stop Time 6962   PT Time Calculation (min) 42 min   Activity Tolerance Patient tolerated treatment well;No increased pain   Behavior During Therapy [facility] for tasks assessed/performed      No past medical history on file.  Past Surgical History:  Procedure Laterality Date  . KNEE ARTHROSCOPY    . KNEE ARTHROSCOPY W/ ACL RECONSTRUCTION      There were no vitals filed for this visit.      Subjective Assessment - 04/24/16 0803    Subjective Pt feels things are improving, but he continues to notice his Lt elbow fatigues with carrying his daugther (~20#) or yard work (carrying bags of leaves).  He is unable to return to working out his regular routine due to "twinges" in his Lt elbow with certain exercises. Currently using 5# weights at home for HEP, noticing early fatigue.     Currently in Pain? No/denies            [facility] PT Assessment - 04/24/16 0001      Assessment   Medical Diagnosis Lt forearm pain   Referring Provider Dr. Steva Colder    Onset Date/Surgical Date 01/02/16   Hand Dominance Right   Next MD Visit PRN   Prior Therapy none     Strength   Left Elbow Extension --  5-/5   Right/Left Wrist Left   Left Wrist Flexion 5/5   Left Wrist Extension 5/5   Left Wrist Radial Deviation 5/5  slight pain   Left Wrist Ulnar Deviation 5/5          [facility] Adult PT Treatment/Exercise - 04/24/16 0001      Elbow Exercises   Other elbow exercises Lt forearm pronation with overpressure stretch x 30 sec x 3 reps    Other elbow exercises elbow ext with forearm pronation/ supination stretch with yoga strap x 4 reps.  Lt wrist flexion stretch x 30 sec x 3 reps      Modalities   Modalities Vasopneumatic     Ultrasound   Ultrasound Location Lt origin of brachioradialis and common insertion at lateral epicondyle.    Ultrasound Parameters 100%, 1.1 w/cm2 x 8 min    Ultrasound Goals Pain  tightness     Manual Therapy   Manual Therapy Myofascial release   Soft tissue mobilization Edge tool assistance to Lt wrist flexors and extensors (midbelly to origin), Lt distal bicep, Lt proximal brachioradialis to decrease fascial restrictions and pain.  TPR to Lt wrist extensors.     Myofascial Release to Lt brachioradialis, Lt wrst extensors.              PT Long Term Goals - 04/24/16 0756      PT LONG TERM GOAL #1   Title I with advanced HEP per his previous work out routine ( 04/24/16)    Time 3   Period Weeks   Status On-going     PT LONG TERM GOAL #2   Title demo [place]  elbow strength =/> Rt ( 04/24/16)    Time 3   Period Weeks   Status On-going  Lt arm fatigues after 4-5 reps of same exercise Rt arm can do 12-20, per pt.      PT LONG TERM GOAL #3   Title report ability to carry his child and talk on his cell phone without Lt forearm pain ( 04/24/16)    Time 3   Period Weeks   Status Partially Met     PT LONG TERM GOAL #4   Title improve FOTO =/< 22% limited ( 04/24/16)    Time 3   Period Weeks   Status On-going               Plan - 04/24/16 1123    Clinical Impression Statement Pt demonstrated improved strength with MMT, however pt reports functional weakness with tasks like holding his 2 yr old daughter or holding full lawn bags.  He has attempted to return to working out, but is only able to do 4-5 reps of UE exercises before he has twinges of pain in Lt elbow.  He is now able to use phone in Lt hand without pain/fatigue.  He has partially met his goals and will benefit from continued  PT intervention to maximize functional mobility.   Pt is interested in continuation of therapy.    Rehab Potential Excellent   PT Frequency 2x / week   PT Duration 3 weeks   PT Treatment/Interventions Moist Heat;Ultrasound;Therapeutic exercise;Dry needling;Taping;Manual techniques;Cryotherapy;Electrical Stimulation;Patient/family education;Passive range of motion   PT Next Visit Plan Continue progressive strengthening to Lt elbow (eccentrics); manual techniques as indicated.    Consulted and Agree with Plan of Care Patient      Patient will benefit from skilled therapeutic intervention in order to improve the following deficits and impairments:  Postural dysfunction, Pain, Decreased strength, Impaired UE functional use, Increased muscle spasms  Visit Diagnosis: Pain in left elbow  Muscle weakness (generalized)  Stiffness of left elbow, not elsewhere classified     Problem List Patient Active Problem List   Diagnosis Date Noted  . Left forearm pain 02/18/2016  . Tinea versicolor 10/27/2015  . Well adult 10/27/2015  . 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

## 2016-04-24 NOTE — Therapy (Addendum)
[place], [place], [address] Phone: [phone]   Fax:  [phone]  Physical Therapy Treatment  Patient Details  Name: Jesus Baker MRN: [id number] Date of Birth: 04-20-1976 Referring Provider: Dr. Steva Colder   Encounter Date: 04/24/2016      PT End of Session - 04/24/16 0805    Visit Number 4   Number of Visits 16   Date for PT Re-Evaluation 06/05/16   PT Start Time 0713   PT Stop Time 5974   PT Time Calculation (min) 42 min   Activity Tolerance Patient tolerated treatment well;No increased pain   Behavior During Therapy [facility] for tasks assessed/performed      No past medical history on file.  Past Surgical History:  Procedure Laterality Date  . KNEE ARTHROSCOPY    . KNEE ARTHROSCOPY W/ ACL RECONSTRUCTION      There were no vitals filed for this visit.      Subjective Assessment - 04/24/16 0803    Subjective Pt feels things are improving, but he continues to notice his Lt elbow fatigues with carrying his daugther (~20#) or yard work (carrying bags of leaves).  He is unable to return to working out his regular routine due to "twinges" in his Lt elbow with certain exercises.    Currently in Pain? No/denies            [facility] PT Assessment - 04/24/16 0001      Assessment   Medical Diagnosis Lt forearm pain   Referring Provider Dr. Steva Colder    Onset Date/Surgical Date 01/02/16   Hand Dominance Right   Next MD Visit PRN   Prior Therapy none     Strength   Left Elbow Extension --  5-/5   Right/Left Wrist Left   Left Wrist Flexion 5/5   Left Wrist Extension 5/5   Left Wrist Radial Deviation 5/5  slight pain   Left Wrist Ulnar Deviation 5/5                     [facility] Adult PT Treatment/Exercise - 04/24/16 0001      Elbow Exercises   Other elbow exercises Lt forearm pronation with overpressure stretch x 30 sec x 3 reps    Other elbow exercises elbow ext with forearm  pronation/ supination stretch with yoga strap x 4 reps.  Lt wrist flexion stretch x 30 sec x 3 reps      Modalities   Modalities Vasopneumatic     Ultrasound   Ultrasound Location Lt origin of brachioradialis and common insertion at lateral epicondyle.    Ultrasound Parameters 100%, 1.1 w/cm2 x 8 min    Ultrasound Goals Pain  tightness     Manual Therapy   Manual Therapy Myofascial release   Soft tissue mobilization Edge tool assistance to Lt wrist flexors and extensors (midbelly to origin), Lt distal bicep, Lt proximal brachioradialis to decrease fascial restrictions and pain.  TPR to Lt wrist extensors.     Myofascial Release to Lt brachioradialis, Lt wrst extensors.                       PT Long Term Goals - 04/24/16 0756      PT LONG TERM GOAL #1   Title I with advanced HEP per his previous work out routine ( 06/05/16)   Time 9   Period Weeks   Status Revised     PT LONG  TERM GOAL #2   Title demo Lt elbow strength =/> Rt ( 06/05/16)    Time 9   Period Weeks   Status Revised  Lt arm fatigues after 4-5 reps of same exercise Rt arm can do 12-20, per pt.      PT LONG TERM GOAL #3   Title report ability to carry his child and talk on his cell phone without Lt forearm pain ( 06/05/16)    Time 9   Period Weeks   Status Revised     PT LONG TERM GOAL #4   Title improve FOTO =/< 22% limited ( 06/05/16)    Time 9   Period Weeks   Status Revised               Plan - 04/24/16 1123    Clinical Impression Statement Pt demonstrated improved strength with MMT, however pt reports functional weakness with tasks like holding his 2 yr old daughter or holding full lawn bags.  He has attempted to return to working out, but is only able to do 4-5 reps of UE exercises before he has twinges of pain in Lt elbow.  He is now able to use phone in Lt hand without pain/fatigue.  He has partially met his goals and will benefit from continued PT intervention to maximize functional  mobility.   Pt is interested in continuation of therapy.    Rehab Potential Excellent   PT Frequency 2x / week   PT Duration 3 weeks   PT Treatment/Interventions Moist Heat;Ultrasound;Therapeutic exercise;Dry needling;Taping;Manual techniques;Cryotherapy;Electrical Stimulation;Patient/family education;Passive range of motion   PT Next Visit Plan Continue progressive strengthening to Lt elbow (eccentrics); manual techniques as indicated.    Consulted and Agree with Plan of Care Patient      Patient will benefit from skilled therapeutic intervention in order to improve the following deficits and impairments:  Postural dysfunction, Pain, Decreased strength, Impaired UE functional use, Increased muscle spasms  Visit Diagnosis: Pain in left elbow - Plan: PT plan of care cert/re-cert  Muscle weakness (generalized) - Plan: PT plan of care cert/re-cert  Stiffness of left elbow, not elsewhere classified - Plan: PT plan of care cert/re-cert     Problem List Patient Active Problem List   Diagnosis Date Noted  . Left forearm pain 02/18/2016  . Tinea versicolor 10/27/2015  . Well adult 10/27/2015  . 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 Patient is being discharged due to not returning since the last visit.  ?????     Jeral Pinch, PT 06/27/16 8:44 AM

## 2016-05-09 ENCOUNTER — Encounter: Payer: BLUE CROSS/BLUE SHIELD | Admitting: Physical Therapy

## 2016-06-02 DIAGNOSIS — F4323 Adjustment disorder with mixed anxiety and depressed mood: Secondary | ICD-10-CM | POA: Diagnosis not present

## 2016-06-05 ENCOUNTER — Encounter: Payer: BLUE CROSS/BLUE SHIELD | Admitting: Physical Therapy

## 2016-06-09 DIAGNOSIS — F4323 Adjustment disorder with mixed anxiety and depressed mood: Secondary | ICD-10-CM | POA: Diagnosis not present

## 2016-06-16 DIAGNOSIS — H11442 Conjunctival cysts, left eye: Secondary | ICD-10-CM | POA: Diagnosis not present

## 2016-06-19 ENCOUNTER — Ambulatory Visit: Payer: BLUE CROSS/BLUE SHIELD | Admitting: Osteopathic Medicine

## 2016-06-19 ENCOUNTER — Emergency Department (INDEPENDENT_AMBULATORY_CARE_PROVIDER_SITE_OTHER)
Admission: EM | Admit: 2016-06-19 | Discharge: 2016-06-19 | Disposition: A | Discharge status: Home / Self Care | Payer: BLUE CROSS/BLUE SHIELD | Source: Home / Self Care | Attending: Family Medicine | Admitting: Family Medicine

## 2016-06-19 DIAGNOSIS — Z0000 Encounter for general adult medical examination without abnormal findings: Secondary | ICD-10-CM

## 2016-06-19 DIAGNOSIS — J029 Acute pharyngitis, unspecified: Secondary | ICD-10-CM | POA: Diagnosis not present

## 2016-06-19 DIAGNOSIS — Z0189 Encounter for other specified special examinations: Secondary | ICD-10-CM

## 2016-06-19 LAB — POCT RAPID STREP A (OFFICE): Rapid Strep A Screen: NEGATIVE

## 2016-06-19 NOTE — ED Triage Notes (Signed)
Pt's child tested positive for strep, and would like a strep test.

## 2016-06-19 NOTE — ED Provider Notes (Signed)
[facility]    CSN: [id number] Arrival date & time: 06/19/16  1746     History   Chief Complaint Chief Complaint  Patient presents with  . Medical Clearance    Check for strep    HPI Jesus Baker is a 41 y.o. male.   Patient is concerned that he could be a strep carrier and presents for testing.  His child tested positive for strep yesterday.  Patient is completely assymptomatic at present.  He has had negative strep test in the past.   The history is provided by the patient.    History reviewed. No pertinent past medical history.  Patient Active Problem List   Diagnosis Date Noted  . Left forearm pain 02/18/2016  . Tinea versicolor 10/27/2015  . Well adult 10/27/2015  . 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 KNEE ARTHROSCOPY    . KNEE ARTHROSCOPY W/ ACL RECONSTRUCTION         Home Medications    Prior to Admission medications   Medication Sig Start Date End Date Taking? Authorizing Provider  Cholecalciferol (VITAMIN D-3 PO) Take 1 capsule by mouth daily.    Historical Provider, MD  MAGNESIUM PO Take by mouth.    Historical Provider, MD  modafinil (PROVIGIL) 200 MG tablet Take 1 tablet (200 mg total) by mouth daily as needed. 10/27/15   Rodolph Bong, MD  Multiple Vitamin (MULTIVITAMIN) capsule Take 1 capsule by mouth daily.    Historical Provider, MD  Nutritional Supplements (CALCIUM D-GLUCARATE PO) Take 1 capsule by mouth daily.    Historical Provider, MD  Omega-3 Fatty Acids (FISH OIL) 1000 MG CAPS Take 1 capsule by mouth daily.    Historical Provider, MD  sodium bicarbonate 325 MG tablet Take 325 mg by mouth daily.    Historical Provider, MD    Family History Family History  Problem Relation Age of Onset  . Cancer Maternal Grandfather     melanoma  . Stroke Maternal Grandfather   . Hyperlipidemia Father   . Hypertension Mother     Social History Social History  Substance Use Topics    . Smoking status: Never Smoker  . Smokeless tobacco: Never Used  . Alcohol use 1.8 oz/week    3 Standard drinks or equivalent per week     Allergies   Poison ivy extract [poison ivy extract]; Poison oak extract [poison oak extract]; and Sumac   Review of Systems Review of Systems No sore throat No cough No pleuritic pain No wheezing No nasal congestion No post-nasal drainage No sinus pain/pressure No itchy/red eyes No earache No hemoptysis No SOB No fever/chills No nausea No vomiting No abdominal pain No diarrhea No urinary symptoms No skin rash No fatigue No myalgias No headache    Physical Exam Triage Vital Signs ED Triage Vitals  Enc Vitals Group     BP 06/19/16 1813 (!) 159/101     Pulse Rate 06/19/16 1813 (!) 59     Resp --      Temp 06/19/16 1813 97.6 F (36.4 C)     Temp Source 06/19/16 1813 Oral     SpO2 06/19/16 1813 100 %     Weight 06/19/16 1814 167 lb (75.8 kg)     Height 06/19/16 1814 5\' 7"  (1.702 m)     Head Circumference --      Peak Flow --      Pain Score --  Pain Loc --      Pain Edu? --      Excl. in GC? --    No data found.   Updated Vital Signs BP (!) 159/101 (BP Location: Left Arm)   Pulse (!) 59   Temp 97.6 F (36.4 C) (Oral)   Ht 5\' 7"  (1.702 m)   Wt 167 lb (75.8 kg)   SpO2 100%   BMI 26.16 kg/m   Visual Acuity Right Eye Distance:   Left Eye Distance:   Bilateral Distance:    Right Eye Near:   Left Eye Near:    Bilateral Near:     Physical Exam Nursing notes and Vital Signs reviewed. Appearance:  Patient appears stated age, and in no acute distress Eyes:  Pupils are equal, round, and reactive to light and accomodation.  Extraocular movement is intact.  Conjunctivae are not inflamed  Ears:  Canals normal.  Tympanic membranes normal.  Nose:   Normal turbinates.  No sinus tenderness.    Pharynx:  Normal Neck:  Supple.  No adenopathy or thyromegaly. Lungs:  Clear to auscultation.  Breath sounds are equal.   Moving air well. Heart:  Regular rate and rhythm without murmurs, rubs, or gallops.  Abdomen:  Nontender without masses or hepatosplenomegaly.  Bowel sounds are present.  No CVA or flank tenderness.  Extremities:  No edema.  Skin:  No rash present.    UC Treatments / Results  Labs (all labs ordered are listed, but only abnormal results are displayed) Labs Reviewed  POCT RAPID STREP A (OFFICE) negative    EKG  EKG Interpretation None       Radiology No results found.  Procedures Procedures (including critical care time)  Medications Ordered in UC Medications - No data to display   Initial Impression / Assessment and Plan / UC Course  I have reviewed the triage vital signs and the nursing notes.  Pertinent labs & imaging results that were available during my care of the patient were reviewed by me and considered in my medical decision making (see chart for details).    No evidence active strep infection Patient concerned about carrier state.  Will send strep DNA probe.    Final Clinical Impressions(s) / UC Diagnoses   Final diagnoses:  Encounter for routine laboratory testing    New Prescriptions New Prescriptions   No medications on file     Lattie HawStephen A Win Guajardo, MD 06/19/16 1910

## 2016-06-20 ENCOUNTER — Telehealth: Payer: Self-pay

## 2016-06-20 ENCOUNTER — Telehealth: Payer: Self-pay | Admitting: Emergency Medicine

## 2016-06-20 LAB — STREP A DNA PROBE: GASP: NOT DETECTED

## 2016-06-20 NOTE — Telephone Encounter (Signed)
Called to ask if labs could be added to 06/21/16 to include screening for hemochromatosis and clotting disorders. He has just learned that he has relatives with both conditions. If so, should he be fasting for labs?

## 2016-06-20 NOTE — Telephone Encounter (Signed)
Being fasting for labs is probably a good idea but is not necessary if it is inconvenient.

## 2016-06-20 NOTE — Telephone Encounter (Signed)
Called patient gave lab results.

## 2016-06-20 NOTE — Telephone Encounter (Signed)
Called patient with results of Strep test.  VM full.

## 2016-06-21 ENCOUNTER — Encounter: Payer: Self-pay | Admitting: Family Medicine

## 2016-06-21 ENCOUNTER — Ambulatory Visit (INDEPENDENT_AMBULATORY_CARE_PROVIDER_SITE_OTHER): Payer: BLUE CROSS/BLUE SHIELD | Admitting: Family Medicine

## 2016-06-21 VITALS — BP 136/94 | HR 82 | Wt 167.0 lb

## 2016-06-21 DIAGNOSIS — Z148 Genetic carrier of other disease: Secondary | ICD-10-CM | POA: Insufficient documentation

## 2016-06-21 DIAGNOSIS — Z832 Family history of diseases of the blood and blood-forming organs and certain disorders involving the immune mechanism: Secondary | ICD-10-CM

## 2016-06-21 DIAGNOSIS — B360 Pityriasis versicolor: Secondary | ICD-10-CM

## 2016-06-21 DIAGNOSIS — E559 Vitamin D deficiency, unspecified: Secondary | ICD-10-CM

## 2016-06-21 DIAGNOSIS — R5383 Other fatigue: Secondary | ICD-10-CM

## 2016-06-21 LAB — LIPID PANEL W/REFLEX DIRECT LDL
CHOLESTEROL: 190 mg/dL (ref <200)
HDL: 43 mg/dL (ref >40)
LDL-Cholesterol: 125 mg/dL — ABNORMAL HIGH
Non-HDL Cholesterol (Calc): 147 mg/dL — ABNORMAL HIGH (ref <130)
Total CHOL/HDL Ratio: 4.4 Ratio (ref <5.0)
Triglycerides: 108 mg/dL (ref <150)

## 2016-06-21 LAB — COMPLETE METABOLIC PANEL WITH GFR
ALBUMIN: 4.6 g/dL (ref 3.6–5.1)
ALK PHOS: 62 U/L (ref 40–115)
ALT: 18 U/L (ref 9–46)
AST: 19 U/L (ref 10–40)
BILIRUBIN TOTAL: 0.9 mg/dL (ref 0.2–1.2)
BUN: 20 mg/dL (ref 7–25)
CALCIUM: 9.8 mg/dL (ref 8.6–10.3)
CO2: 28 mmol/L (ref 20–31)
Chloride: 103 mmol/L (ref 98–110)
Creat: 1.36 mg/dL — ABNORMAL HIGH (ref 0.60–1.35)
GFR, EST AFRICAN AMERICAN: 75 mL/min (ref >=60)
GFR, Est Non African American: 65 mL/min (ref >=60)
GLUCOSE: 81 mg/dL (ref 65–99)
POTASSIUM: 4.2 mmol/L (ref 3.5–5.3)
Sodium: 139 mmol/L (ref 135–146)
TOTAL PROTEIN: 7.1 g/dL (ref 6.1–8.1)

## 2016-06-21 LAB — CBC
HEMATOCRIT: 47.2 % (ref 38.5–50.0)
HEMOGLOBIN: 16.2 g/dL (ref 13.2–17.1)
MCH: 31 pg (ref 27.0–33.0)
MCHC: 34.3 g/dL (ref 32.0–36.0)
MCV: 90.2 fL (ref 80.0–100.0)
MPV: 9.1 fL (ref 7.5–12.5)
Platelets: 151 10*3/uL (ref 140–400)
RBC: 5.23 MIL/uL (ref 4.20–5.80)
RDW: 13.1 % (ref 11.0–15.0)
WBC: 5.9 10*3/uL (ref 3.8–10.8)

## 2016-06-21 LAB — IRON AND TIBC
%SAT: 33 % (ref 15–60)
Iron: 96 ug/dL (ref 50–180)
TIBC: 292 ug/dL (ref 250–425)
UIBC: 196 ug/dL (ref 125–400)

## 2016-06-21 MED ORDER — FLUCONAZOLE 150 MG PO TABS: 300.0000 mg | ORAL_TABLET | ORAL | 1 refills | Status: DC

## 2016-06-21 NOTE — Progress Notes (Signed)
Jesus Baker is a 41 y.o. male who presents to [facility] today for fatigue: Screening for hemachromatosis and hypercoagulable state, and tinea versicolor.  Fatigue: Patient is a 2 day history of fatigue associated with mild congestion. He's feeling reasonably well and denies any shortness of breath with exertion chest pain or palpitations. He's tried some over-the-counter medications which have helped a bit. No vomiting or diarrhea.  Hemachromatosis: Patient has a strong family history of hemachromatosis with 2 maternal uncles with hemachromatosis. He notes that he had any genetic test done that showed that he is a carrier for the hemachromatosis gene.  To his knowledge she's never been tested.  Hypercoagulable: Patient additionally notes a strong family history for clotting disorders. He has an uncle that has had repeated pulmonary embolisms. He denies any personal history of blood clots. He denies any leg swelling or shortness of breath today.  Tinea versicolor: Patient has a history of recurrent tinea versicolor. He previously has been treated with topical he notes that it keeps returning. He is interested in oral treatment if possible.   No past medical history on file. Past Surgical History:  Procedure Laterality Date  . KNEE ARTHROSCOPY    . KNEE ARTHROSCOPY W/ ACL RECONSTRUCTION     Social History  Substance Use Topics  . Smoking status: Never Smoker  . Smokeless tobacco: Never Used  . Alcohol use 1.8 oz/week    3 Standard drinks or equivalent per week   family history includes Cancer in his maternal grandfather; Hyperlipidemia in his father; Hypertension in his mother; Stroke in his maternal grandfather.  ROS as above:  Medications: Current Outpatient Prescriptions  Medication Sig Dispense Refill  . Cholecalciferol (VITAMIN D-3 PO) Take 1 capsule by mouth  daily.    Marland Kitchen MAGNESIUM PO Take by mouth.    . modafinil (PROVIGIL) 200 MG tablet Take 1 tablet (200 mg total) by mouth daily as needed. 30 tablet 1  . Multiple Vitamin (MULTIVITAMIN) capsule Take 1 capsule by mouth daily.    . Nutritional Supplements (CALCIUM D-GLUCARATE PO) Take 1 capsule by mouth daily.    . Omega-3 Fatty Acids (FISH OIL) 1000 MG CAPS Take 1 capsule by mouth daily.    . sodium bicarbonate 325 MG tablet Take 325 mg by mouth daily.    . fluconazole (DIFLUCAN) 150 MG tablet Take 2 tablets (300 mg total) by mouth once a week. 4 tablet 1   No current facility-administered medications for this visit.    Allergies  Allergen Reactions  . Poison Ivy Extract [Poison Ivy Extract]   . Poison Oak Extract [employer] Extract]   . Sumac     Health Maintenance Health Maintenance  Topic Date Due  . HIV Screening  01/05/1991  . TETANUS/TDAP  07/06/2025  . INFLUENZA VACCINE  Completed     Exam:  BP (!) 136/94   Pulse 82   Wt 167 lb (75.8 kg)   SpO2 98%   BMI 26.16 kg/m  Gen: Well NAD Nontoxic appearing HEENT: EOMI,  MMM Normal posterior pharynx Lungs: Normal work of breathing. CTABL Heart: RRR no MRG Abd: NABS, Soft. Nondistended, Nontender Exts: Brisk capillary refill, warm and well perfused. No palpable cords. Skin multiple hypopigmented macular lesions on trunk   Results for orders placed or performed during the hospital encounter of 06/19/16 (from the past 72 hour(s))  POCT rapid strep A     Status: None   Collection  Time: 06/19/16  6:21 PM  Result Value Ref Range   Rapid Strep A Screen Negative Negative  Strep A DNA probe     Status: None   Collection Time: 06/19/16  7:08 PM  Result Value Ref Range   GASP NOT DETECTED    GASP NOD    No results found.    Assessment and Plan: 41 y.o. male with  Fatigue: Likely mild viral URI. Plan for watchful waiting. 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  Hypercoagulable family history: Will check factor V Leiden.  Tinea versicolor: Treat with oral fluconazole as patient has failed topical treatment.  History of vitamin D deficiency: Check vitamin D labs today.   Orders Placed This Encounter  Procedures  . CBC  . COMPLETE METABOLIC PANEL WITH GFR  . Lipid Panel w/reflex Direct LDL  . Ferritin  . Iron and TIBC  . Factor 5 leiden  . VITAMIN D 25 Hydroxy (Vit-D Deficiency, Fractures)   Meds ordered this encounter  Medications  . fluconazole (DIFLUCAN) 150 MG tablet    Sig: Take 2 tablets (300 mg total) by mouth once a week.    Dispense:  4 tablet    Refill:  1     Discussed warning signs or symptoms. Please see discharge instructions. Patient expresses understanding.

## 2016-06-21 NOTE — Patient Instructions (Signed)
Thank you for coming in today. Get fasting labs today.  Take fluconazole 300mg  once weekly for 2 weeks.   Return as needed.

## 2016-06-22 ENCOUNTER — Encounter: Payer: Self-pay | Admitting: Family Medicine

## 2016-06-22 LAB — FERRITIN: FERRITIN: 237 ng/mL (ref 20–380)

## 2016-06-22 LAB — VITAMIN D 25 HYDROXY (VIT D DEFICIENCY, FRACTURES): VIT D 25 HYDROXY: 53 ng/mL (ref 30–100)

## 2016-06-23 DIAGNOSIS — F4323 Adjustment disorder with mixed anxiety and depressed mood: Secondary | ICD-10-CM | POA: Diagnosis not present

## 2016-06-26 LAB — FACTOR 5 LEIDEN

## 2016-08-28 ENCOUNTER — Emergency Department (INDEPENDENT_AMBULATORY_CARE_PROVIDER_SITE_OTHER)
Admission: EM | Admit: 2016-08-28 | Discharge: 2016-08-28 | Disposition: A | Discharge status: Home / Self Care | Payer: BLUE CROSS/BLUE SHIELD | Source: Home / Self Care | Attending: Emergency Medicine | Admitting: Emergency Medicine

## 2016-08-28 ENCOUNTER — Encounter: Payer: Self-pay | Admitting: *Deleted

## 2016-08-28 DIAGNOSIS — Z20818 Contact with and (suspected) exposure to other bacterial communicable diseases: Secondary | ICD-10-CM | POA: Diagnosis not present

## 2016-08-28 DIAGNOSIS — J029 Acute pharyngitis, unspecified: Secondary | ICD-10-CM | POA: Diagnosis not present

## 2016-08-28 NOTE — ED Provider Notes (Signed)
[facility] CSN: [id number] Arrival date & time: 08/28/16  1428     History   Chief Complaint Chief Complaint  Patient presents with  . Sore Throat    HPI Jesus Baker is a 41 y.o. male.   HPI  2 yo daughter has + strep infxn.  he is asymptomatic,  Request strep testing because other family members have had strep  In he past.  So, his wife  and 2 other asymptomatic children are being tested for strep No st or ent sxs.   also of note, his BP is usually normal , but he just took a phone call informing him that his mother is in ER with a sepaterate problem, and he feels that's why his we discussed this briefly, and he'll followup with PCP. History reviewed. No pertinent past medical history.  Patient Active Problem List   Diagnosis Date Noted  . 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 Left forearm pain 02/18/2016  . Tinea versicolor 10/27/2015  . Well adult 10/27/2015  . 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 KNEE ARTHROSCOPY    . KNEE ARTHROSCOPY W/ ACL RECONSTRUCTION         Home Medications    Prior to Admission medications   Medication Sig Start Date End Date Taking? Authorizing Provider  Cholecalciferol (VITAMIN D-3 PO) Take 1 capsule by mouth daily.    Historical Provider, MD  MAGNESIUM PO Take by mouth.    Historical Provider, MD  modafinil (PROVIGIL) 200 MG tablet Take 1 tablet (200 mg total) by mouth daily as needed. 10/27/15   Rodolph Bong, MD  Multiple Vitamin (MULTIVITAMIN) capsule Take 1 capsule by mouth daily.    Historical Provider, MD  Nutritional Supplements (CALCIUM D-GLUCARATE PO) Take 1 capsule by mouth daily.    Historical Provider, MD  Omega-3 Fatty Acids (FISH OIL) 1000 MG CAPS Take 1 capsule by mouth daily.    Historical Provider, MD  sodium bicarbonate 325 MG tablet Take 325 mg by mouth daily.    Historical Provider, MD    Family History Family History  Problem Relation  Age of Onset  . Cancer Maternal Grandfather     melanoma  . Stroke Maternal Grandfather   . Hyperlipidemia Father   . Hypertension Mother     Social History Social History  Substance Use Topics  . Smoking status: Never Smoker  . Smokeless tobacco: Never Used  . Alcohol use 1.8 oz/week    3 Standard drinks or equivalent per week     Allergies   Poison ivy extract [poison ivy extract]; Poison oak extract [poison oak extract]; and Sumac   Review of Systems Review of Systems  All other systems reviewed and are negative.    Physical Exam Triage Vital Signs ED Triage Vitals  Enc Vitals Group     BP 08/28/16 1449 (!) 156/101     Pulse Rate 08/28/16 1449 61     Resp 08/28/16 1449 16     Temp 08/28/16 1449 97.7 F (36.5 C)     Temp Source 08/28/16 1449 Oral     SpO2 08/28/16 1449 99 %     Weight 08/28/16 1450 168 lb (76.2 kg)     Height --      Head Circumference --      Peak Flow --      Pain Score 08/28/16 1450 0     Pain Loc --  Pain Edu? --      Excl. in GC? --    No data found.   Updated Vital Signs BP (!) 156/101 (BP Location: Left Arm)   Pulse 61   Temp 97.7 F (36.5 C) (Oral)   Resp 16   Wt 168 lb (76.2 kg)   SpO2 99%   BMI 26.31 kg/m   Visual Acuity Right Eye Distance:   Left Eye Distance:   Bilateral Distance:    Right Eye Near:   Left Eye Near:    Bilateral Near:     Physical Exam  Constitutional: He is oriented to person, place, and time. He appears well-developed and well-nourished. No distress.  HENT:  Head: Normocephalic and atraumatic.  Mouth/Throat: Uvula is midline and oropharynx is clear and moist. No oral lesions. No uvula swelling. No oropharyngeal exudate, posterior oropharyngeal edema, posterior oropharyngeal erythema or tonsillar abscesses.  Eyes: Pupils are equal, round, and reactive to light. No scleral icterus.  Neck: Normal range of motion. Neck supple.  Cardiovascular: Normal rate and regular rhythm.     Pulmonary/Chest: Effort normal.  Abdominal: He exhibits no distension.  Lymphadenopathy:    He has no cervical adenopathy.  Neurological: He is alert and oriented to person, place, and time. No cranial nerve deficit.  Skin: Skin is warm and dry.  Psychiatric: He has a normal mood and affect. His behavior is normal.  Vitals reviewed.    UC Treatments / Results  Labs (all labs ordered are listed, but only abnormal results are displayed) Labs Reviewed  STREP A DNA PROBE  POCT RAPID STREP A (OFFICE)    EKG  EKG Interpretation None       Radiology No results found.  Procedures Procedures (including critical care time)  Medications Ordered in UC Medications - No data to display   Initial Impression / Assessment and Plan / UC Course  I have reviewed the triage vital signs and the nursing notes.  Pertinent labs & imaging results that were available during my care of the patient were reviewed by me and considered in my medical decision making (see chart for details).       Final Clinical Impressions(s) / UC Diagnoses   Final diagnoses:  Strep throat exposure  rst neg.  send off strep culture. Discussed at length.     Lajean Manes, MD 08/28/16 1536

## 2016-08-28 NOTE — ED Triage Notes (Signed)
Pt is here today for a strep test. Denies s/s. Daughter with positive strep.

## 2016-08-29 ENCOUNTER — Telehealth: Payer: Self-pay | Admitting: *Deleted

## 2016-08-29 LAB — STREP A DNA PROBE: GASP: NOT DETECTED

## 2016-08-29 LAB — POCT RAPID STREP A (OFFICE): Rapid Strep A Screen: NEGATIVE

## 2016-08-29 NOTE — Telephone Encounter (Signed)
Encounter created to enter Strep test Order and result not entered on DOS.

## 2016-08-29 NOTE — Telephone Encounter (Signed)
Spoke to pts wife given Tcx results. Clemens Catholic, LPN

## 2016-09-05 ENCOUNTER — Other Ambulatory Visit: Payer: Self-pay | Admitting: *Deleted

## 2016-09-05 MED ORDER — AMOXICILLIN 500 MG PO CAPS: 500.0000 mg | ORAL_CAPSULE | Freq: Two times a day (BID) | ORAL | 0 refills | Status: DC

## 2016-09-05 NOTE — Telephone Encounter (Signed)
He may have contracted the strep throat later given that he presented without symptoms.  Will send in amoxicillin for him to start taking. Please encourage patient to take entire course of medication.  It may take 4-5 days to start feeling improvement. If continuing to worse, please have him be reevaluated by a medical provider.

## 2016-09-05 NOTE — Telephone Encounter (Signed)
Patient called reporting he was seen 4/23/218 for a strep test. He was asymptomatic at the time, his daughter was diagnosed with strep the same day. His TCX was negative. Reports Dr. Georgina Pillion advised him to call if he developed symptoms as the test may have been too early. He is c/o severe sore throat.

## 2016-09-07 ENCOUNTER — Encounter: Payer: Self-pay | Admitting: Family Medicine

## 2016-09-08 MED ORDER — MODAFINIL 200 MG PO TABS: 200.0000 mg | ORAL_TABLET | Freq: Every day | ORAL | 5 refills | Status: DC | PRN

## 2016-09-12 ENCOUNTER — Telehealth: Payer: Self-pay | Admitting: Family Medicine

## 2016-09-12 NOTE — Telephone Encounter (Signed)
Received PA request for Modafinil sent through cover my meds waiting on determination. - CF

## 2016-09-14 NOTE — Telephone Encounter (Signed)
PA approved for modafinil. Message left on pharm vm and patient vm

## 2016-10-18 ENCOUNTER — Emergency Department (INDEPENDENT_AMBULATORY_CARE_PROVIDER_SITE_OTHER)
Admission: EM | Admit: 2016-10-18 | Discharge: 2016-10-18 | Disposition: A | Discharge status: Home / Self Care | Payer: BLUE CROSS/BLUE SHIELD | Source: Home / Self Care

## 2016-10-18 ENCOUNTER — Encounter: Payer: Self-pay | Admitting: *Deleted

## 2016-10-18 DIAGNOSIS — Z111 Encounter for screening for respiratory tuberculosis: Secondary | ICD-10-CM

## 2016-10-18 MED ORDER — TUBERCULIN PPD 5 UNIT/0.1ML ID SOLN
5.0000 [IU] | Freq: Once | INTRADERMAL | Status: DC
  Administered 2016-10-18: 5 [IU] via INTRADERMAL

## 2016-10-18 NOTE — ED Triage Notes (Signed)
The patient is here today for PPD placement. Shalayne Leach, LPN   

## 2017-02-01 ENCOUNTER — Emergency Department (INDEPENDENT_AMBULATORY_CARE_PROVIDER_SITE_OTHER)
Admission: EM | Admit: 2017-02-01 | Discharge: 2017-02-01 | Disposition: A | Discharge status: Home / Self Care | Payer: BLUE CROSS/BLUE SHIELD | Source: Home / Self Care | Attending: Family Medicine | Admitting: Family Medicine

## 2017-02-01 ENCOUNTER — Encounter: Payer: Self-pay | Admitting: Emergency Medicine

## 2017-02-01 ENCOUNTER — Emergency Department (INDEPENDENT_AMBULATORY_CARE_PROVIDER_SITE_OTHER): Payer: BLUE CROSS/BLUE SHIELD

## 2017-02-01 DIAGNOSIS — N50811 Right testicular pain: Secondary | ICD-10-CM

## 2017-02-01 DIAGNOSIS — R1031 Right lower quadrant pain: Secondary | ICD-10-CM

## 2017-02-01 DIAGNOSIS — N433 Hydrocele, unspecified: Secondary | ICD-10-CM

## 2017-02-01 DIAGNOSIS — R52 Pain, unspecified: Secondary | ICD-10-CM

## 2017-02-01 DIAGNOSIS — R109 Unspecified abdominal pain: Secondary | ICD-10-CM

## 2017-02-01 LAB — POCT URINALYSIS DIP (MANUAL ENTRY)
Bilirubin, UA: NEGATIVE
Blood, UA: NEGATIVE
Glucose, UA: NEGATIVE mg/dL
Ketones, POC UA: NEGATIVE mg/dL
Leukocytes, UA: NEGATIVE
Nitrite, UA: NEGATIVE
Protein Ur, POC: NEGATIVE mg/dL
Spec Grav, UA: 1.015 (ref 1.010–1.025)
Urobilinogen, UA: 0.2 E.U./dL
pH, UA: 7 (ref 5.0–8.0)

## 2017-02-01 NOTE — ED Triage Notes (Signed)
LBP x 3 days radiates to right groin and testicle. Back pain is resolved but right groin and testicle are hurting at times pain is sharp.

## 2017-02-01 NOTE — Discharge Instructions (Signed)
°  You may take 500mg acetaminophen every 4-6 hours or in combination with ibuprofen 400-600mg every 6-8 hours as needed for pain.  ° ° ° °

## 2017-02-01 NOTE — ED Provider Notes (Signed)
[facility]    CSN: [id number] Arrival date & time: 02/01/17  0807     History   Chief Complaint Chief Complaint  Patient presents with  . Testicle Pain    HPI Jesus Baker is a 41 y.o. male.   HPI  Jesus Baker is a 41 y.o. male presenting to UC with c/o 3 days of intermittent Right lower back and Right flank pain that radiates into Right groin and Right testicle.  Pain is sharp and stabbing at times, waking him from his sleep last night.  He also reports a mild burning sensation on the front of his upper Right thigh.  The back pain has seemed to have resolved but the pain is now more consistent in Right groin and testicle. Denies scrotal swelling. Denies rashes or bruising. Denies fever, chills, n/v/d. Denies urinary symptoms. No prior hx of kidney stones but states multiple people in his family have had kidney stones.   BP elevated in triage. He notes his BP is always elevated at the doctor's office.    History reviewed. No pertinent past medical history.  Patient Active Problem List   Diagnosis Date Noted  . 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 Left forearm pain 02/18/2016  . Tinea versicolor 10/27/2015  . Well adult 10/27/2015  . 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 KNEE ARTHROSCOPY    . KNEE ARTHROSCOPY W/ ACL RECONSTRUCTION         Home Medications    Prior to Admission medications   Medication Sig Start Date End Date Taking? Authorizing Provider  ibuprofen (ADVIL,MOTRIN) 200 MG tablet Take 200 mg by mouth every 6 (six) hours as needed.   Yes [provider]  Cholecalciferol (VITAMIN D-3 PO) Take 1 capsule by mouth daily.    [provider]  MAGNESIUM PO Take by mouth.    [provider]  modafinil (PROVIGIL) 200 MG tablet Take 1 tablet (200 mg total) by mouth daily as needed. 09/08/16   Rodolph Bong, MD  Multiple Vitamin (MULTIVITAMIN) capsule Take 1 capsule by mouth  daily.    [provider]  Nutritional Supplements (CALCIUM D-GLUCARATE PO) Take 1 capsule by mouth daily.    [provider]  Omega-3 Fatty Acids (FISH OIL) 1000 MG CAPS Take 1 capsule by mouth daily.    [provider]  sodium bicarbonate 325 MG tablet Take 325 mg by mouth daily.    [provider]    Family History Family History  Problem Relation Age of Onset  . Cancer Maternal Grandfather        melanoma  . Stroke Maternal Grandfather   . Hyperlipidemia Father   . Hypertension Mother     Social History Social History  Substance Use Topics  . Smoking status: Never Smoker  . Smokeless tobacco: Never Used  . Alcohol use 1.8 oz/week    3 Standard drinks or equivalent per week     Allergies   Poison ivy extract [poison ivy extract]; Poison oak extract [poison oak extract]; and Sumac   Review of Systems Review of Systems  Gastrointestinal: Positive for abdominal pain (Right groin). Negative for diarrhea, nausea and vomiting.  Genitourinary: Positive for flank pain (Right) and testicular pain (Right). Negative for discharge, dysuria, frequency, hematuria, penile pain, penile swelling and scrotal swelling.  Musculoskeletal: Positive for back pain (Right mid to low). Negative for myalgias.  Skin: Negative for color change and rash.  Physical Exam Triage Vital Signs ED Triage Vitals  Enc Vitals Group     BP      Pulse      Resp      Temp      Temp src      SpO2      Weight      Height      Head Circumference      Peak Flow      Pain Score      Pain Loc      Pain Edu?      Excl. in GC?    No data found.   Updated Vital Signs BP (!) 140/99 (BP Location: Left Arm)   Pulse (!) 53   Temp 97.6 F (36.4 C) (Oral)   Ht  (1.676 m)   Wt 169 lb (76.7 kg)   SpO2 100%   BMI 27.28 kg/m   Visual Acuity Right Eye Distance:   Left Eye Distance:   Bilateral Distance:    Right Eye Near:   Left Eye Near:    Bilateral  Near:     Physical Exam  Constitutional: He is oriented to person, place, and time. He appears well-developed and well-nourished. No distress.  HENT:  Head: Normocephalic and atraumatic.  Mouth/Throat: Oropharynx is clear and moist.  Eyes: EOM are normal.  Neck: Normal range of motion.  Cardiovascular: Normal rate and regular rhythm.   Pulmonary/Chest: Effort normal and breath sounds normal. No respiratory distress. He has no wheezes. He has no rales.  Abdominal: Soft. He exhibits no distension and no mass. There is no tenderness. There is no rebound, no guarding and no CVA tenderness. No hernia.  Musculoskeletal: Normal range of motion.  Neurological: He is alert and oriented to person, place, and time.  Skin: Skin is warm and dry. He is not diaphoretic.  Psychiatric: He has a normal mood and affect. His behavior is normal.  Nursing note and vitals reviewed.    UC Treatments / Results  Labs (all labs ordered are listed, but only abnormal results are displayed) Labs Reviewed  POCT URINALYSIS DIP (MANUAL ENTRY)    EKG  EKG Interpretation None       Radiology Dg Abdomen 1 View  Result Date: 02/01/2017 CLINICAL DATA:  Right groin pain and right flank pain EXAM: ABDOMEN - 1 VIEW COMPARISON:  None. FINDINGS: The bowel gas pattern is normal. No radio-opaque calculi or other significant radiographic abnormality are seen. IMPRESSION: Negative. Electronically Signed   By: Marlan Palau M.D.   On: 02/01/2017 09:12   US Scrotum  Result Date: 02/01/2017 CLINICAL DATA:  Right groin pain. EXAM: SCROTAL ULTRASOUND DOPPLER ULTRASOUND OF THE TESTICLES TECHNIQUE: Complete ultrasound examination of the testicles, epididymis, and other scrotal structures was performed. Color and spectral Doppler ultrasound were also utilized to evaluate blood flow to the testicles. COMPARISON:  No recent prior . FINDINGS: Right testicle Measurements: 4.5 x 2.0 x 3.4 cm. No mass or microlithiasis visualized.  Left testicle Measurements: 4.4 x 2.2 x 3.2 cm. No mass or microlithiasis visualized. Right epididymis:  Normal in size and appearance. Left epididymis:  Normal in size and appearance. Hydrocele:  Small right hydrocele. Varicocele:  None visualized. Pulsed Doppler interrogation of both testes demonstrates normal low resistance arterial and venous waveforms bilaterally. IMPRESSION: 1. Small right hydrocele. 2. Exam otherwise unremarkable. No evidence of testicular mass or torsion. Electronically Signed   By: Maisie Fus  Register   On: 02/01/2017 09:23   [place]  Scrotom Doppler  Result Date: 02/01/2017 CLINICAL DATA:  Right groin pain. EXAM: SCROTAL ULTRASOUND DOPPLER ULTRASOUND OF THE TESTICLES TECHNIQUE: Complete ultrasound examination of the testicles, epididymis, and other scrotal structures was performed. Color and spectral Doppler ultrasound were also utilized to evaluate blood flow to the testicles. COMPARISON:  No recent prior . FINDINGS: Right testicle Measurements: 4.5 x 2.0 x 3.4 cm. No mass or microlithiasis visualized. Left testicle Measurements: 4.4 x 2.2 x 3.2 cm. No mass or microlithiasis visualized. Right epididymis:  Normal in size and appearance. Left epididymis:  Normal in size and appearance. Hydrocele:  Small right hydrocele. Varicocele:  None visualized. Pulsed Doppler interrogation of both testes demonstrates normal low resistance arterial and venous waveforms bilaterally. IMPRESSION: 1. Small right hydrocele. 2. Exam otherwise unremarkable. No evidence of testicular mass or torsion. Electronically Signed   By: Maisie Fus  Register   On: 02/01/2017 09:23    Procedures Procedures (including critical care time)  Medications Ordered in UC Medications - No data to display   Initial Impression / Assessment and Plan / UC Course  I have reviewed the triage vital signs and the nursing notes.  Pertinent labs & imaging results that were available during my care of the patient were reviewed by me and  considered in my medical decision making (see chart for details).     UA: normal KUB: no evidence or ureteral stone(s) U/S scrotum c/w small Right hydrocele, otherwise, normal. No masses or torsion.   Discussed imaging with pt. Still possible to have already passed a very small stone. Encouraged good hydration. May have acetaminophen and ibuprofen as needed for pain.  F/u with PCP and urologist as needed.   Final Clinical Impressions(s) / UC Diagnoses   Final diagnoses:  Right groin pain  Right testicular pain  Right flank pain  Right hydrocele    New Prescriptions Discharge Medication List as of 02/01/2017  9:35 AM       Controlled Substance Prescriptions [place] Controlled Substance Registry consulted? Not Applicable   Rolla Plate 02/01/17 6962

## 2017-02-02 ENCOUNTER — Encounter: Payer: Self-pay | Admitting: Family Medicine

## 2017-02-20 ENCOUNTER — Encounter: Payer: Self-pay | Admitting: Family Medicine

## 2017-02-20 ENCOUNTER — Ambulatory Visit (INDEPENDENT_AMBULATORY_CARE_PROVIDER_SITE_OTHER): Payer: BLUE CROSS/BLUE SHIELD | Admitting: Family Medicine

## 2017-02-20 VITALS — BP 143/68 | HR 66 | Temp 98.6°F | Wt 168.0 lb

## 2017-02-20 DIAGNOSIS — S39012A Strain of muscle, fascia and tendon of lower back, initial encounter: Secondary | ICD-10-CM | POA: Diagnosis not present

## 2017-02-20 NOTE — Progress Notes (Signed)
Jesus Baker is a 41 y.o. male who presents to [facility] today for back pain. Patient has right-sided low back pain. He was seen in urgent care September 27 for right low back pain radiating to the right side that to be flank pain. He had some urinary symptoms at the same time and he was suspicious for having a urinary tract infection or kidney stone. His urinalysis was normal as was his KUB. He did have a scrotal ultrasound as well as he had some pain radiating to the scrotum which showed only a small hydrocele. He denies any current urinary symptoms but does note some continued right low back pain. He notes the pain does not radiate currently. Pain is worse with low back motion. He denies any weakness or numbness fevers or chills.   No past medical history on file. Past Surgical History:  Procedure Laterality Date  . KNEE ARTHROSCOPY    . KNEE ARTHROSCOPY W/ ACL RECONSTRUCTION     Social History  Substance Use Topics  . Smoking status: Never Smoker  . Smokeless tobacco: Never Used  . Alcohol use 1.8 oz/week    3 Standard drinks or equivalent per week     ROS:  As above   Medications: Current Outpatient Prescriptions  Medication Sig Dispense Refill  . Cholecalciferol (VITAMIN D-3 PO) Take 1 capsule by mouth daily.    Marland Kitchen ibuprofen (ADVIL,MOTRIN) 200 MG tablet Take 200 mg by mouth every 6 (six) hours as needed.    Marland Kitchen MAGNESIUM PO Take by mouth.    . modafinil (PROVIGIL) 200 MG tablet Take 1 tablet (200 mg total) by mouth daily as needed. 30 tablet 5  . Multiple Vitamin (MULTIVITAMIN) capsule Take 1 capsule by mouth daily.    . Nutritional Supplements (CALCIUM D-GLUCARATE PO) Take 1 capsule by mouth daily.    . Omega-3 Fatty Acids (FISH OIL) 1000 MG CAPS Take 1 capsule by mouth daily.    . sodium bicarbonate 325 MG tablet Take 325 mg by mouth daily.     No current facility-administered medications for this visit.    Allergies  Allergen  Reactions  . Poison Ivy Extract [Poison Ivy Extract]   . Poison Oak Extract [employer] Extract]   . Sumac      Exam:  BP (!) 143/68   Pulse 66   Temp 98.6 F (37 C) (Oral)   Wt 168 lb (76.2 kg)   BMI 27.12 kg/m   General: Well Developed, well nourished, and in no acute distress.  Neuro/Psych: Alert and oriented x3, extra-ocular muscles intact, able to move all 4 extremities, sensation grossly intact. Skin: Warm and dry, no rashes noted.  Respiratory: Not using accessory muscles, speaking in full sentences, trachea midline.  Cardiovascular: Pulses palpable, no extremity edema. Abdomen: Does not appear distended. MSK: L-spine nontender to spinal midline. Normal back motion however patient does experience pain with rotation and lateral flexion on the right side. Lower sore strength reflexes and sensation are equal and normal throughout. Negative slump test bilaterally. Normal gait.    No results found for this or any previous visit (from the past 48 hour(s)). No results found.    Assessment and Plan: 41 y.o. male with lumbosacral strain likely of the quadratus lumborum. We discussed options. Plan for trial of home exercises and refer to physical therapy. Recommend heating pad and a TENS unit. Patient declined offered muscle relaxers. Recommend prescription strength NSAIDs. Very doubtful for any urologic issues today  based on symptoms and exam    Orders Placed This Encounter  Procedures  . Ambulatory referral to Physical Therapy    Referral Priority:   Routine    Referral Type:   Physical Medicine    Referral Reason:   Specialty Services Required    Requested Specialty:   Physical Therapy   No orders of the defined types were placed in this encounter.   Discussed warning signs or symptoms. Please see discharge instructions. Patient expresses understanding.

## 2017-02-20 NOTE — Patient Instructions (Signed)
Thank you for coming in today. Use a heating pad.  Attend PT if needed.  Work on core strength and hip abduction strength TENS UNIT: This is helpful for muscle pain and spasm.   Search and Purchase a TENS 7000 2nd edition at  www.tenspros.com or www.Amazon.com It should be less than $30.     TENS unit instructions: Do not shower or bathe with the unit on Turn the unit off before removing electrodes or batteries If the electrodes lose stickiness add a drop of water to the electrodes after they are disconnected from the unit and place on plastic sheet. If you continued to have difficulty, call the TENS unit company to purchase more electrodes. Do not apply lotion on the skin area prior to use. Make sure the skin is clean and dry as this will help prolong the life of the electrodes. After use, always check skin for unusual red areas, rash or other skin difficulties. If there are any skin problems, does not apply electrodes to the same area. Never remove the electrodes from the unit by pulling the wires. Do not use the TENS unit or electrodes other than as directed. Do not change electrode placement without consultating your therapist or physician. Keep 2 fingers with between each electrode. Wear time ratio is 2:1, on to off times.    For example on for 30 minutes off for 15 minutes and then on for 30 minutes off for 15 minutes     Lumbosacral Strain Lumbosacral strain is an injury that causes pain in the lower back (lumbosacral spine). This injury usually occurs from overstretching the muscles or ligaments along your spine. A strain can affect one or more muscles or cord-like tissues that connect bones to other bones (ligaments). What are the causes? This condition may be caused by:  A hard, direct hit (blow) to the back.  Excessive stretching of the lower back muscles. This may result from: ? A fall. ? Lifting something heavy. ? Repetitive movements such as bending or  crouching.  What increases the risk? The following factors may increase your risk of getting this condition:  Participating in sports or activities that involve: ? A sudden twist of the back. ? Pushing or pulling motions.  Being overweight or obese.  Having poor strength and flexibility, especially tight hamstrings or weak muscles in the back or abdomen.  Having too much of a curve in the lower back.  Having a pelvis that is tilted forward.  What are the signs or symptoms? The main symptom of this condition is pain in the lower back, at the site of the strain. Pain may extend (radiate) down one or both legs. How is this diagnosed? This condition is diagnosed based on:  Your symptoms.  Your medical history.  A physical exam. ? Your health care provider may push on certain areas of your back to determine the source of your pain. ? You may be asked to bend forward, backward, and side to side to assess the severity of your pain and your range of motion.  Imaging tests, such as: ? X-rays. ? MRI.  How is this treated? Treatment for this condition may include:  Putting heat and cold on the affected area.  Medicines to help relieve pain and relax your muscles (muscle relaxants).  NSAIDs to help reduce swelling and discomfort.  When your symptoms improve, it is important to gradually return to your normal routine as soon as possible to reduce pain, avoid stiffness, and avoid  loss of muscle strength. Generally, symptoms should improve within 6 weeks of treatment. However, recovery time varies. Follow these instructions at home: Managing pain, stiffness, and swelling   If directed, put ice on the injured area during the first 24 hours after your strain. ? Put ice in a plastic bag. ? Place a towel between your skin and the bag. ? Leave the ice on for 20 minutes, 2-3 times a day.  If directed, put heat on the affected area as often as told by your health care provider. Use the  heat source that your health care provider recommends, such as a moist heat pack or a heating pad. ? Place a towel between your skin and the heat source. ? Leave the heat on for 20-30 minutes. ? Remove the heat if your skin turns bright red. This is especially important if you are unable to feel pain, heat, or cold. You may have a greater risk of getting burned. Activity  Rest and return to your normal activities as told by your health care provider. Ask your health care provider what activities are safe for you.  Avoid activities that take a lot of energy for as long as told by your health care provider. General instructions  Take over-the-counter and prescription medicines only as told by your health care provider.  Donot drive or use heavy machinery while taking prescription pain medicine.  Do not use any products that contain nicotine or tobacco, such as cigarettes and e-cigarettes. If you need help quitting, ask your health care provider.  Keep all follow-up visits as told by your health care provider. This is important. How is this prevented?  Use correct form when playing sports and lifting heavy objects.  Use good posture when sitting and standing.  Maintain a healthy weight.  Sleep on a mattress with medium firmness to support your back.  Be safe and responsible while being active to avoid falls.  Do at least 150 minutes of moderate-intensity exercise each week, such as brisk walking or water aerobics. Try a form of exercise that takes stress off your back, such as swimming or stationary cycling.  Maintain physical fitness, including: ? Strength. ? Flexibility. ? Cardiovascular fitness. ? Endurance. Contact a health care provider if:  Your back pain does not improve after 6 weeks of treatment.  Your symptoms get worse. Get help right away if:  Your back pain is severe.  You cannot stand or walk.  You have difficulty controlling when you urinate or when you  have a bowel movement.  You feel nauseous or you vomit.  Your feet get very cold.  You have numbness, tingling, weakness, or problems using your arms or legs.  You develop any of the following: ? Shortness of breath. ? Dizziness. ? Pain in your legs. ? Weakness in your buttocks or legs. ? Discoloration of the skin on your toes or legs. This information is not intended to replace advice given to you by your health care provider. Make sure you discuss any questions you have with your health care provider. Document Released: 02/01/2005 Document Revised: 11/12/2015 Document Reviewed: 09/26/2015 Elsevier Interactive Patient Education  2017 [employer].

## 2017-06-14 ENCOUNTER — Telehealth: Payer: Self-pay | Admitting: Family Medicine

## 2017-06-14 MED ORDER — OSELTAMIVIR PHOSPHATE 75 MG PO CAPS: 75.0000 mg | ORAL_CAPSULE | Freq: Every day | ORAL | 0 refills | Status: DC

## 2017-06-14 NOTE — Telephone Encounter (Signed)
Called patient but could not leave a vm due to vm being full. Rhonda Cunningham,CMA

## 2017-06-14 NOTE — Telephone Encounter (Signed)
Tamiflu sent for prophylaxis for flu for your and Wynona CanesChristine as requested.

## 2017-06-15 NOTE — Telephone Encounter (Signed)
Spoke to patient advised that Tamiflu has been sent to pharmacy. Rhonda Cunningham,CMA

## 2017-06-22 ENCOUNTER — Encounter: Payer: Self-pay | Admitting: Family Medicine

## 2017-06-22 DIAGNOSIS — Z125 Encounter for screening for malignant neoplasm of prostate: Secondary | ICD-10-CM

## 2017-07-15 ENCOUNTER — Emergency Department
Admission: EM | Admit: 2017-07-15 | Discharge: 2017-07-15 | Discharge status: Against Medical Advice | Payer: BLUE CROSS/BLUE SHIELD | Source: Home / Self Care

## 2017-09-20 ENCOUNTER — Encounter: Payer: Self-pay | Admitting: Family Medicine

## 2017-09-21 MED ORDER — FLUCONAZOLE 150 MG PO TABS: 300.0000 mg | ORAL_TABLET | ORAL | 1 refills | Status: DC

## 2017-10-24 ENCOUNTER — Encounter: Payer: Self-pay | Admitting: Emergency Medicine

## 2017-10-24 ENCOUNTER — Other Ambulatory Visit: Payer: Self-pay

## 2017-10-24 ENCOUNTER — Emergency Department (INDEPENDENT_AMBULATORY_CARE_PROVIDER_SITE_OTHER)
Admission: EM | Admit: 2017-10-24 | Discharge: 2017-10-24 | Disposition: A | Discharge status: Home / Self Care | Payer: BLUE CROSS/BLUE SHIELD | Source: Home / Self Care

## 2017-10-24 DIAGNOSIS — Z111 Encounter for screening for respiratory tuberculosis: Secondary | ICD-10-CM | POA: Diagnosis not present

## 2017-10-24 NOTE — ED Triage Notes (Signed)
Patient here for PPD placement; may not return here for reading; all information and copy of TB Skin test form given to him.

## 2017-10-27 IMAGING — US US SCROTUM
1 series · 14 of 25 positions shown · non-contrast
Comparison: No recent prior .

CLINICAL DATA: Right groin pain.

EXAM:
SCROTAL ULTRASOUND
DOPPLER ULTRASOUND OF THE TESTICLES
TECHNIQUE: Complete ultrasound examination of the testicles, epididymis, and
other scrotal structures was performed. Color and spectral Doppler
ultrasound were also utilized to evaluate blood flow to the
testicles.

[Series 1: us scrotum · 0.09mm/px · 14 of 61 slices shown]
[im 1/61]
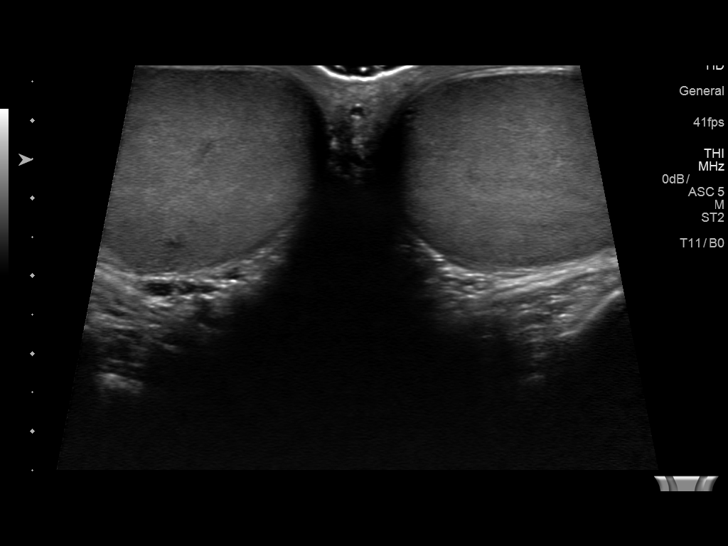
[im 6/61]
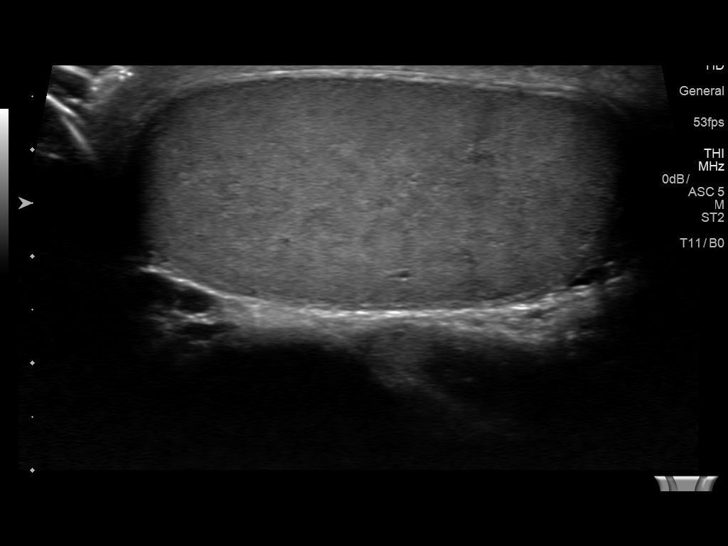
[im 11/61]
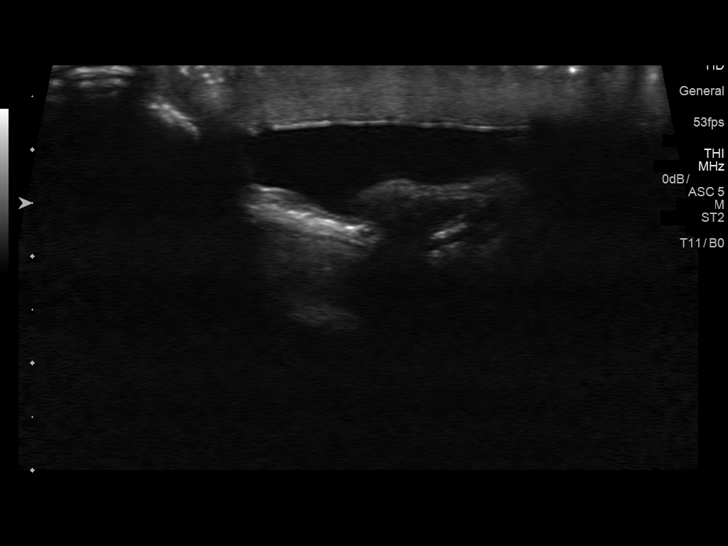
[im 16/61]
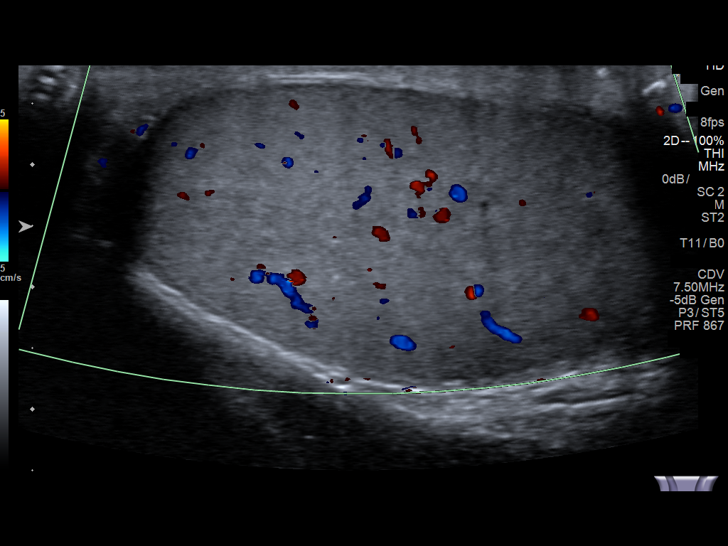
[im 21/61]
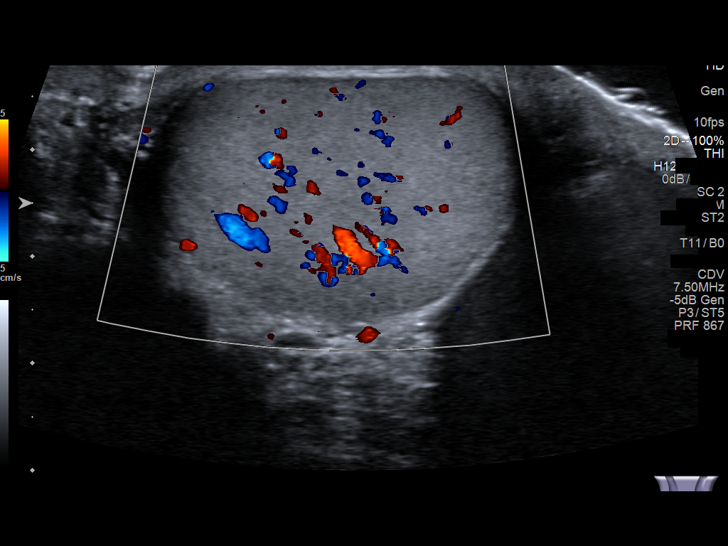
[im 23/61]
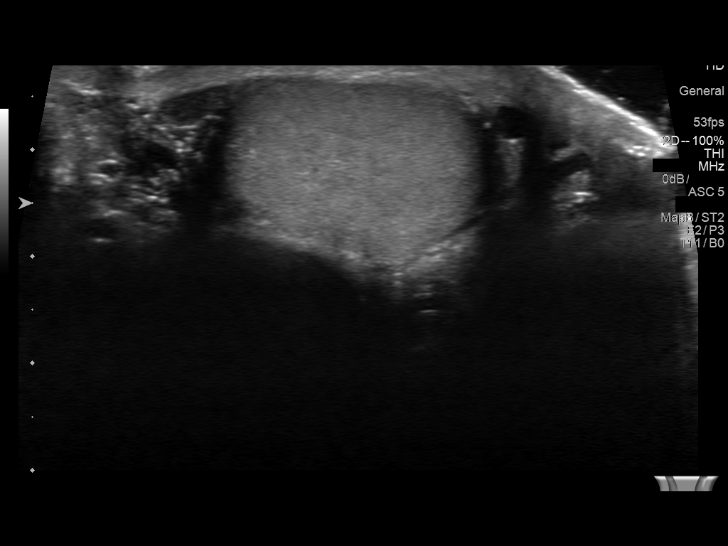
[im 28/61]
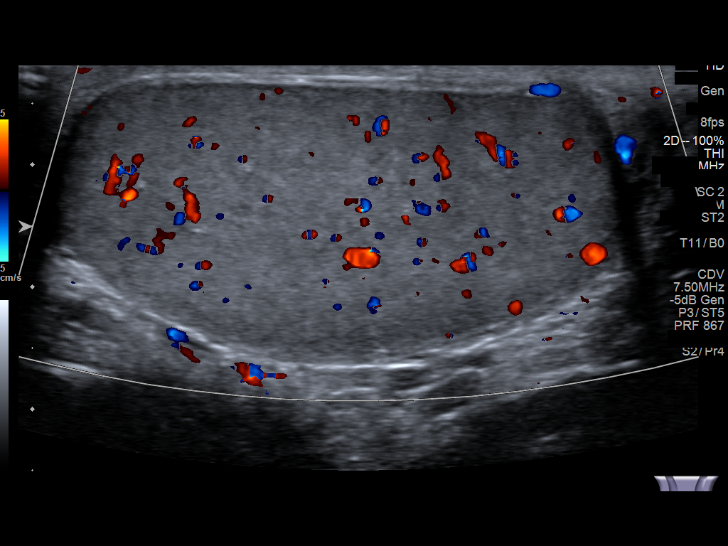
[im 33/61]
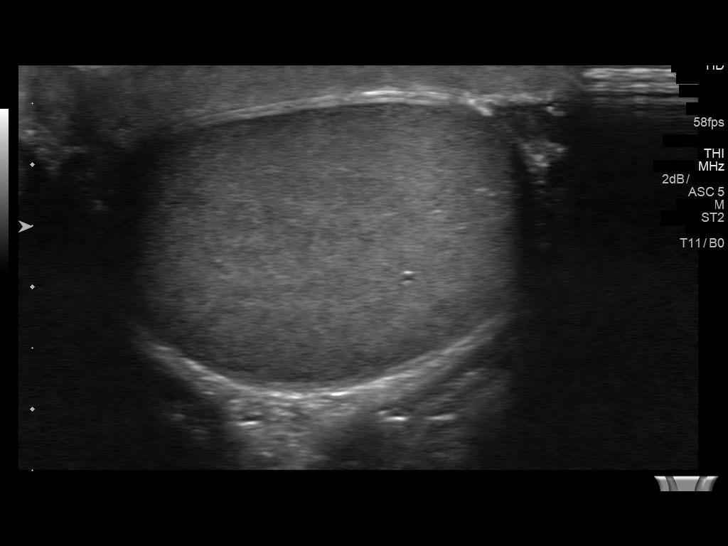
[im 38/61]
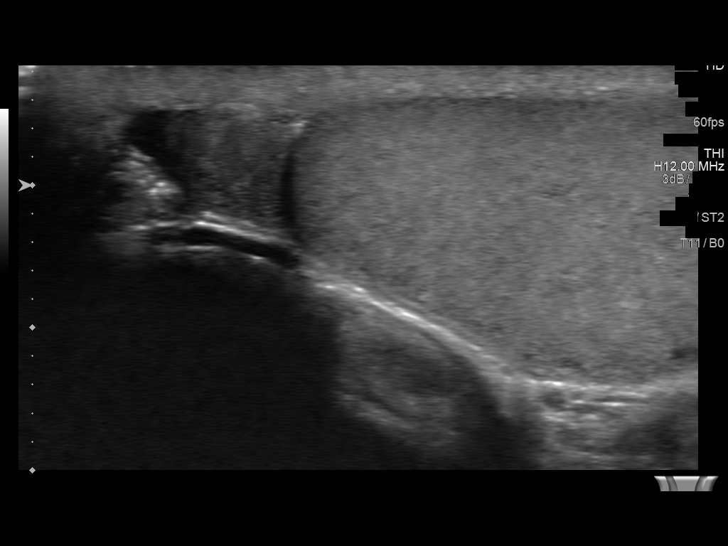
[im 41/61]
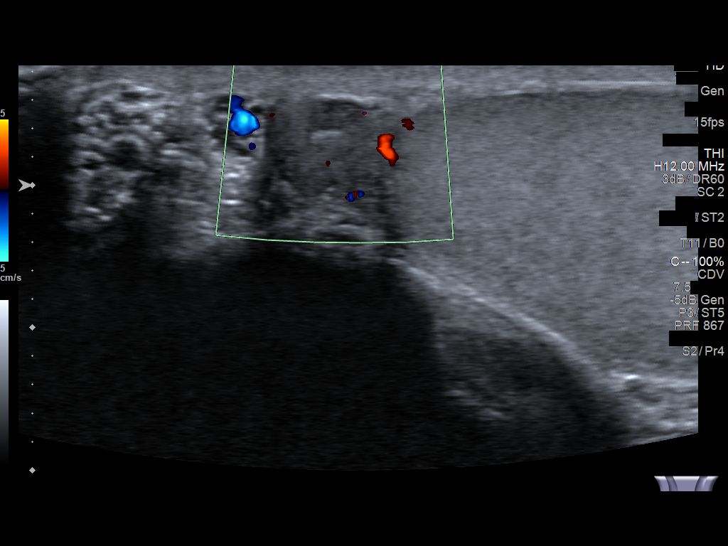
[im 46/61]
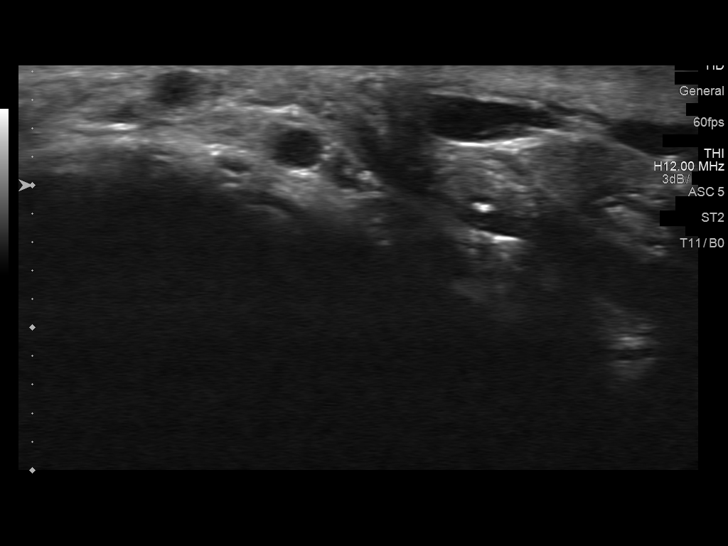
[im 51/61]
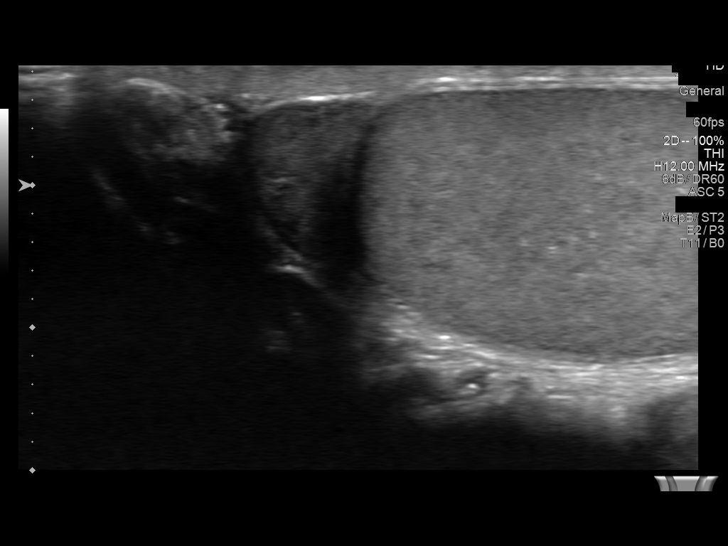
[im 56/61]
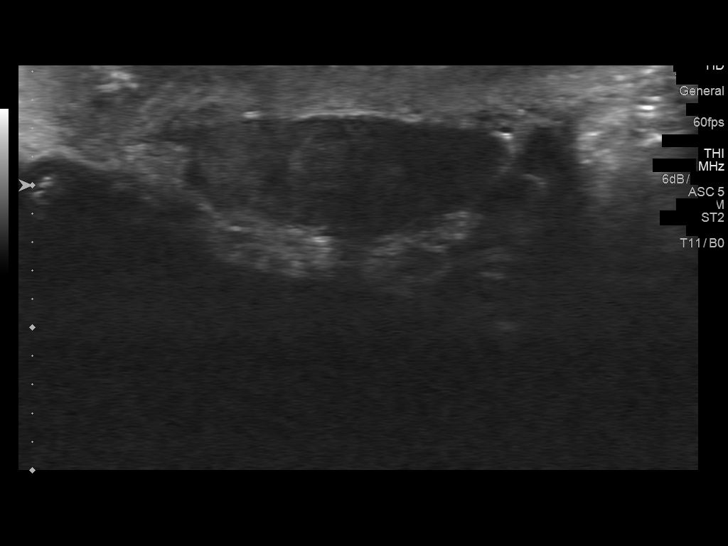
[im 61/61]
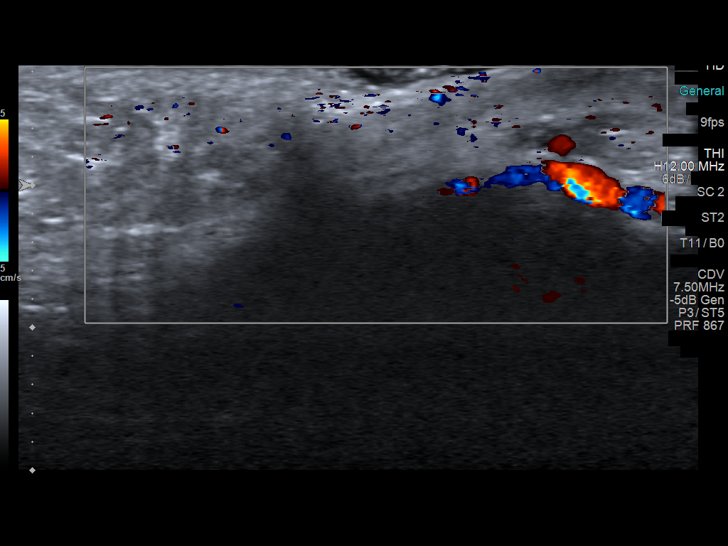

[14 of 25 positions shown; findings below may reference images not displayed]

FINDINGS: Right testicle

Measurements: 4.5 x 2.0 x 3.4 cm. No mass or microlithiasis
visualized.

Left testicle

Measurements: 4.4 x 2.2 x 3.2 cm. No mass or microlithiasis
visualized.

Right epididymis:  Normal in size and appearance.

Left epididymis:  Normal in size and appearance.

Hydrocele:  Small right hydrocele.

Varicocele:  None visualized.

Pulsed Doppler interrogation of both testes demonstrates normal low
resistance arterial and venous waveforms bilaterally.
IMPRESSION: 1. Small right hydrocele.

2. Exam otherwise unremarkable. No evidence of testicular mass or
torsion.

## 2017-11-26 ENCOUNTER — Emergency Department
Admission: EM | Admit: 2017-11-26 | Discharge: 2017-11-26 | Discharge status: Absent without leave | Payer: Self-pay | Source: Home / Self Care

## 2017-12-10 ENCOUNTER — Encounter: Payer: Self-pay | Admitting: Family Medicine

## 2017-12-10 MED ORDER — SELENIUM SULFIDE 2.5 % EX LOTN
1.0000 | TOPICAL_LOTION | Freq: Every day | CUTANEOUS | 1 refills | Status: AC
Start: 2017-12-10 — End: 2017-12-17

## 2017-12-10 MED ORDER — MODAFINIL 200 MG PO TABS: 200.0000 mg | ORAL_TABLET | Freq: Every day | ORAL | 0 refills | Status: DC | PRN

## 2017-12-28 ENCOUNTER — Encounter: Payer: Self-pay | Admitting: Family Medicine

## 2017-12-28 ENCOUNTER — Ambulatory Visit (INDEPENDENT_AMBULATORY_CARE_PROVIDER_SITE_OTHER): Payer: BLUE CROSS/BLUE SHIELD | Admitting: Family Medicine

## 2017-12-28 VITALS — BP 134/86 | HR 82 | Wt 172.0 lb

## 2017-12-28 DIAGNOSIS — Z23 Encounter for immunization: Secondary | ICD-10-CM

## 2017-12-28 DIAGNOSIS — Z148 Genetic carrier of other disease: Secondary | ICD-10-CM | POA: Diagnosis not present

## 2017-12-28 DIAGNOSIS — R7989 Other specified abnormal findings of blood chemistry: Secondary | ICD-10-CM

## 2017-12-28 DIAGNOSIS — E7800 Pure hypercholesterolemia, unspecified: Secondary | ICD-10-CM | POA: Diagnosis not present

## 2017-12-28 DIAGNOSIS — R1011 Right upper quadrant pain: Secondary | ICD-10-CM

## 2017-12-28 NOTE — Progress Notes (Signed)
Jesus Baker is a 42 y.o. male who presents to [facility] today for right upper quadrant abdominal pain.  Jesus Baker notes mild intermittent pain located in the right upper quadrant of his abdomen present for about 2 months.  Pain typically occurs after eating.  He denies any nausea vomiting or diarrhea.  He denies any exertional pain shortness of breath.  He feels well with no fevers chills.  He denies any personal history of gallstones.  He has a personal history of heterozygous genetics for hemochromatosis but when his ferritin was tested in 2018 it was normal.  His hemoglobin has been normal as well.  He feels well otherwise.  133/84 ROS as above:  Exam:  BP 134/86   Pulse 82   Wt 172 lb (78 kg)   BMI 26.15 kg/m  Gen: Well NAD HEENT: EOMI,  MMM Lungs: Normal work of breathing. CTABL Heart: RRR no MRG Abd: NABS, Soft. Nondistended, minimally tender right upper quadrant no masses palpated rebound or guarding.  Negative McMurray sign. Exts: Brisk capillary refill, warm and well perfused.      Assessment and Plan: 42 y.o. male with  Right upper quadrant abdominal pain.  Unclear etiology.  Concerning for gallstone or other liver related issue.  Pancreatitis or duodenal ulcer ulcer on the differential.  Plan for metabolic laboratory work-up below.  Additionally will assess for expressed hemochromatosis with ferritin and CBC.  Will check for gallstones and for steatohepatitis with abdominal ultrasound.  If all is well consider referral to gastroenterology.  Recheck metabolic panel as well as creatinine was minimally elevated at last check in February 2018.  Influenza vaccine given today prior to discharge.   Orders Placed This Encounter  Procedures  . US Abdomen Complete    Standing Status:   Future    Standing Expiration Date:   02/28/2019    Order Specific Question:    Reason for Exam (SYMPTOM  OR DIAGNOSIS REQUIRED)    Answer:   eval RUQ abd pain x 2 months. suspcet gallbaldder.    Order Specific Question:   Preferred imaging location?    Answer:   [facility]  . Flu Vaccine QUAD 36+ mos IM    [facility]  . CBC  . COMPLETE METABOLIC PANEL WITH GFR  . Lipid Panel w/reflex Direct LDL  . Lipase  . Ferritin   No orders of the defined types were placed in this encounter.    Historical information moved to improve visibility of documentation.  No past medical history on file. Past Surgical History:  Procedure Laterality Date  . KNEE ARTHROSCOPY    . KNEE ARTHROSCOPY W/ ACL RECONSTRUCTION     Social History   Tobacco Use  . Smoking status: Never Smoker  . Smokeless tobacco: Never Used  Substance Use Topics  . Alcohol use: Yes    Alcohol/week: 3.0 standard drinks    Types: 3 Standard drinks or equivalent per week   family history includes Cancer in his maternal grandfather; Hyperlipidemia in his father; Hypertension in his mother; Stroke in his maternal grandfather.  Medications: Current Outpatient Medications  Medication Sig Dispense Refill  . Cholecalciferol (VITAMIN D-3 PO) Take 1 capsule by mouth daily.    Marland Kitchen ibuprofen (ADVIL,MOTRIN) 200 MG tablet Take 200 mg by mouth every 6 (six) hours as needed.    Marland Kitchen MAGNESIUM PO Take by mouth.    . modafinil (PROVIGIL) 200 MG tablet Take 1 tablet (200  mg total) by mouth daily as needed. 30 tablet 0  . Multiple Vitamin (MULTIVITAMIN) capsule Take 1 capsule by mouth daily.    . Nutritional Supplements (CALCIUM D-GLUCARATE PO) Take 1 capsule by mouth daily.    . Omega-3 Fatty Acids (FISH OIL) 1000 MG CAPS Take 1 capsule by mouth daily.    Marland Kitchen. oseltamivir (TAMIFLU) 75 MG capsule Take 1 capsule (75 mg total) by mouth daily. 10 capsule 0  . sodium bicarbonate 325 MG tablet Take 325 mg by mouth daily.     No current facility-administered medications for this visit.    Allergies  Allergen  Reactions  . Poison Ivy Extract [Poison Ivy Extract]   . Poison Oak Extract [employer][Poison Oak Extract]   . Sumac      Discussed warning signs or symptoms. Please see discharge instructions. Patient expresses understanding.

## 2017-12-28 NOTE — Patient Instructions (Addendum)
Thank you for coming in today. Get labs and [place] today.  I will get results to you ASAP.   We will progress based on results.   Keep my updated.   If your belly pain worsens, or you have high fever, bad vomiting, blood in your stool or black tarry stool go to the Emergency Room.    Cholecystitis Cholecystitis is inflammation of the gallbladder. It is often called a gallbladder attack. The gallbladder is a pear-shaped organ that lies beneath the liver on the right side of the body. The gallbladder stores bile, which is a fluid that helps the body to digest fats. If bile builds up in your gallbladder, your gallbladder becomes inflamed. This condition may occur suddenly (be acute). Repeat episodes of acute cholecystitis or prolonged episodes may lead to a long-term (chronic) condition. Cholecystitis is serious and it requires treatment. What are the causes? The most common cause of this condition is gallstones. Gallstones can block the tube (duct) that carries bile out of your gallbladder. This causes bile to build up. Other causes of this condition include:  Damage to the gallbladder due to a decrease in blood flow.  Infections in the bile ducts.  Scars or kinks in the bile ducts.  Tumors in the liver, pancreas, or gallbladder.  What increases the risk? This condition is more likely to develop in:  People who have sickle cell disease.  People who take birth control pills or use estrogen.  People who have alcoholic liver disease.  People who have liver cirrhosis.  People who have their nutrition delivered through a vein (parenteral nutrition).  People who do not eat or drink (do fasting) for a long period of time.  People who are obese.  People who have rapid weight loss.  People who are pregnant.  People who have increased triglyceride levels.  People who have pancreatitis.  What are the signs or symptoms? Symptoms of this condition include:  Abdominal pain, especially  in the upper right area of the abdomen.  Abdominal tenderness or bloating.  Nausea.  Vomiting.  Fever.  Chills.  Yellowing of the skin and the whites of the eyes (jaundice).  How is this diagnosed? This condition is diagnosed with a medical history and physical exam. You may also have other tests, including:  Imaging tests, such as: ? An ultrasound of the gallbladder. ? A CT scan of the abdomen. ? A gallbladder nuclear scan (HIDA scan). This scan allows your health care provider to see the bile moving from your liver to your gallbladder and to your small intestine. ? MRI.  Blood tests, such as: ? A complete blood count, because the white blood cell count may be higher than normal. ? Liver function tests, because some levels may be higher than normal with certain types of gallstones.  How is this treated? Treatment may include:  Fasting for a certain amount of time.  IV fluids.  Medicine to treat pain or vomiting.  Antibiotic medicine.  Surgery to remove your gallbladder (cholecystectomy). This may happen immediately or at a later time.  Follow these instructions at home: Home care will depend on your treatment. In general:  Take over-the-counter and prescription medicines only as told by your health care provider.  If you were prescribed an antibiotic medicine, take it as told by your health care provider. Do not stop taking the antibiotic even if you start to feel better.  Follow instructions from your health care provider about what to eat or drink.  When you are allowed to eat, avoid eating or drinking anything that triggers your symptoms.  Keep all follow-up visits as told by your health care provider. This is important.  Contact a health care provider if:  Your pain is not controlled with medicine.  You have a fever. Get help right away if:  Your pain moves to another part of your abdomen or to your back.  You continue to have symptoms or you develop  new symptoms even with treatment. This information is not intended to replace advice given to you by your health care provider. Make sure you discuss any questions you have with your health care provider. Document Released: 04/24/2005 Document Revised: 09/02/2015 Document Reviewed: 08/05/2014 Elsevier Interactive Patient Education  2018 [employer]

## 2017-12-29 LAB — CBC
HCT: 46.3 % (ref 38.5–50.0)
Hemoglobin: 15.9 g/dL (ref 13.2–17.1)
MCH: 30.8 pg (ref 27.0–33.0)
MCHC: 34.3 g/dL (ref 32.0–36.0)
MCV: 89.7 fL (ref 80.0–100.0)
MPV: 9.5 fL (ref 7.5–12.5)
Platelets: 139 10*3/uL — ABNORMAL LOW (ref 140–400)
RBC: 5.16 10*6/uL (ref 4.20–5.80)
RDW: 12.5 % (ref 11.0–15.0)
WBC: 4.3 10*3/uL (ref 3.8–10.8)

## 2017-12-29 LAB — COMPLETE METABOLIC PANEL WITH GFR
AG Ratio: 2 (calc) (ref 1.0–2.5)
ALT: 27 U/L (ref 9–46)
AST: 22 U/L (ref 10–40)
Albumin: 4.6 g/dL (ref 3.6–5.1)
Alkaline phosphatase (APISO): 59 U/L (ref 40–115)
BUN: 21 mg/dL (ref 7–25)
CALCIUM: 9.6 mg/dL (ref 8.6–10.3)
CO2: 28 mmol/L (ref 20–32)
CREATININE: 1.25 mg/dL (ref 0.60–1.35)
Chloride: 103 mmol/L (ref 98–110)
GFR, EST NON AFRICAN AMERICAN: 71 mL/min/{1.73_m2} (ref >=60)
GFR, Est African American: 82 mL/min/{1.73_m2} (ref >=60)
GLOBULIN: 2.3 g/dL (ref 1.9–3.7)
GLUCOSE: 86 mg/dL (ref 65–99)
Potassium: 4.5 mmol/L (ref 3.5–5.3)
SODIUM: 139 mmol/L (ref 135–146)
Total Bilirubin: 0.6 mg/dL (ref 0.2–1.2)
Total Protein: 6.9 g/dL (ref 6.1–8.1)

## 2017-12-29 LAB — LIPID PANEL W/REFLEX DIRECT LDL
CHOL/HDL RATIO: 4.8 (calc) (ref <5.0)
CHOLESTEROL: 226 mg/dL — AB (ref <200)
HDL: 47 mg/dL (ref >40)
LDL CHOLESTEROL (CALC): 161 mg/dL — AB
NON-HDL CHOLESTEROL (CALC): 179 mg/dL — AB (ref <130)
TRIGLYCERIDES: 76 mg/dL (ref <150)

## 2017-12-29 LAB — LIPASE: Lipase: 14 U/L (ref 7–60)

## 2017-12-29 LAB — FERRITIN: Ferritin: 207 ng/mL (ref 38–380)

## 2017-12-31 ENCOUNTER — Encounter: Payer: Self-pay | Admitting: Family Medicine

## 2017-12-31 ENCOUNTER — Ambulatory Visit (INDEPENDENT_AMBULATORY_CARE_PROVIDER_SITE_OTHER): Payer: BLUE CROSS/BLUE SHIELD

## 2017-12-31 DIAGNOSIS — R1011 Right upper quadrant pain: Secondary | ICD-10-CM | POA: Diagnosis not present

## 2017-12-31 DIAGNOSIS — Z148 Genetic carrier of other disease: Secondary | ICD-10-CM | POA: Diagnosis not present

## 2017-12-31 DIAGNOSIS — R7989 Other specified abnormal findings of blood chemistry: Secondary | ICD-10-CM | POA: Diagnosis not present

## 2018-01-01 ENCOUNTER — Encounter: Payer: Self-pay | Admitting: Family Medicine

## 2018-01-01 DIAGNOSIS — R1011 Right upper quadrant pain: Secondary | ICD-10-CM

## 2018-01-10 ENCOUNTER — Encounter: Payer: Self-pay | Admitting: Family Medicine

## 2018-01-23 ENCOUNTER — Telehealth: Payer: Self-pay | Admitting: Family Medicine

## 2018-01-23 MED ORDER — BENZONATATE 200 MG PO CAPS: 200.0000 mg | ORAL_CAPSULE | Freq: Three times a day (TID) | ORAL | 0 refills | Status: DC | PRN

## 2018-01-23 NOTE — Telephone Encounter (Signed)
Tessalon Pearles sent for cough

## 2018-01-29 ENCOUNTER — Encounter: Payer: Self-pay | Admitting: Family Medicine

## 2018-01-31 MED ORDER — MODAFINIL 200 MG PO TABS: 200.0000 mg | ORAL_TABLET | Freq: Every day | ORAL | 5 refills | Status: DC | PRN

## 2018-03-26 DIAGNOSIS — R1011 Right upper quadrant pain: Secondary | ICD-10-CM | POA: Diagnosis not present

## 2018-04-01 ENCOUNTER — Encounter: Payer: Self-pay | Admitting: Family Medicine

## 2018-04-02 MED ORDER — TYPHOID VACCINE PO CPDR: 1.0000 | DELAYED_RELEASE_CAPSULE | ORAL | 0 refills | Status: DC

## 2018-04-08 DIAGNOSIS — R1011 Right upper quadrant pain: Secondary | ICD-10-CM | POA: Diagnosis not present

## 2018-05-07 DIAGNOSIS — E7800 Pure hypercholesterolemia, unspecified: Secondary | ICD-10-CM | POA: Insufficient documentation

## 2018-05-22 DIAGNOSIS — K317 Polyp of stomach and duodenum: Secondary | ICD-10-CM | POA: Diagnosis not present

## 2018-05-22 DIAGNOSIS — R1011 Right upper quadrant pain: Secondary | ICD-10-CM | POA: Diagnosis not present

## 2018-05-22 DIAGNOSIS — B9681 Helicobacter pylori [H. pylori] as the cause of diseases classified elsewhere: Secondary | ICD-10-CM | POA: Diagnosis not present

## 2018-05-22 DIAGNOSIS — K3189 Other diseases of stomach and duodenum: Secondary | ICD-10-CM | POA: Diagnosis not present

## 2018-05-22 DIAGNOSIS — K2950 Unspecified chronic gastritis without bleeding: Secondary | ICD-10-CM | POA: Diagnosis not present

## 2018-05-27 ENCOUNTER — Telehealth: Payer: Self-pay | Admitting: Family Medicine

## 2018-05-27 NOTE — Telephone Encounter (Signed)
Upper endoscopy report from [facility] received.  Gastric polyp present.  Result will be sent to scan.

## 2018-05-31 ENCOUNTER — Telehealth: Payer: Self-pay | Admitting: Family Medicine

## 2018-05-31 DIAGNOSIS — A048 Other specified bacterial intestinal infections: Secondary | ICD-10-CM | POA: Insufficient documentation

## 2018-05-31 NOTE — Telephone Encounter (Signed)
Upper endoscopy pathology report came back showing H. pylori.  Gastroenterology is starting treatment with antibiotic therapy.  Result will be sent to scan.

## 2018-09-03 DIAGNOSIS — H10022 Other mucopurulent conjunctivitis, left eye: Secondary | ICD-10-CM | POA: Diagnosis not present

## 2018-09-25 IMAGING — US US ABDOMEN COMPLETE
1 series · 14 of 25 positions shown · non-contrast
Comparison: None.

CLINICAL DATA: Right upper quadrant pain for 2 months

EXAM:
ABDOMEN ULTRASOUND COMPLETE

[Series 1: us abdomen complete · 0.16mm/px · 14 of 76 slices shown]
[im 1/76]
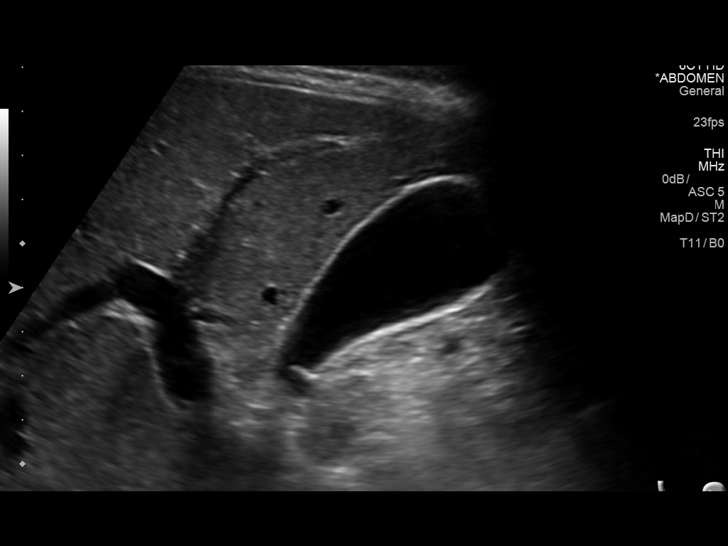
[im 7/76]
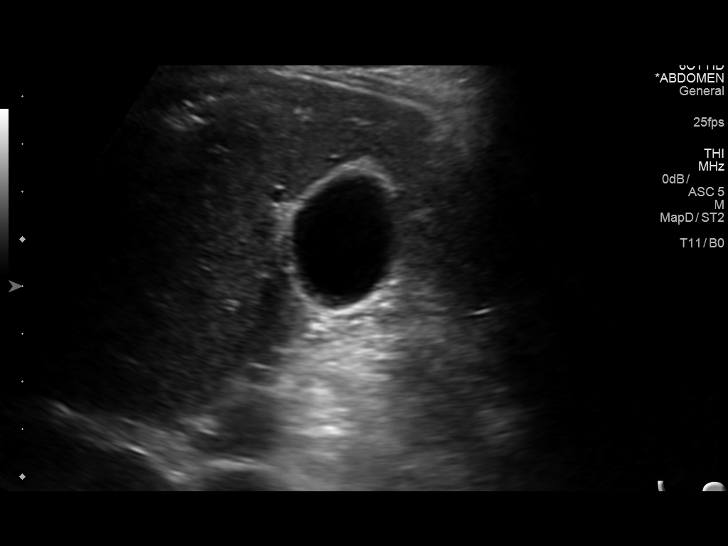
[im 13/76]
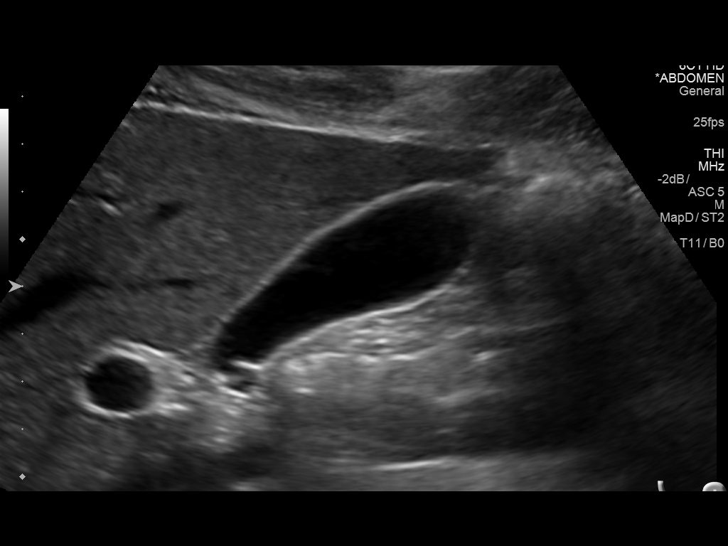
[im 19/76]
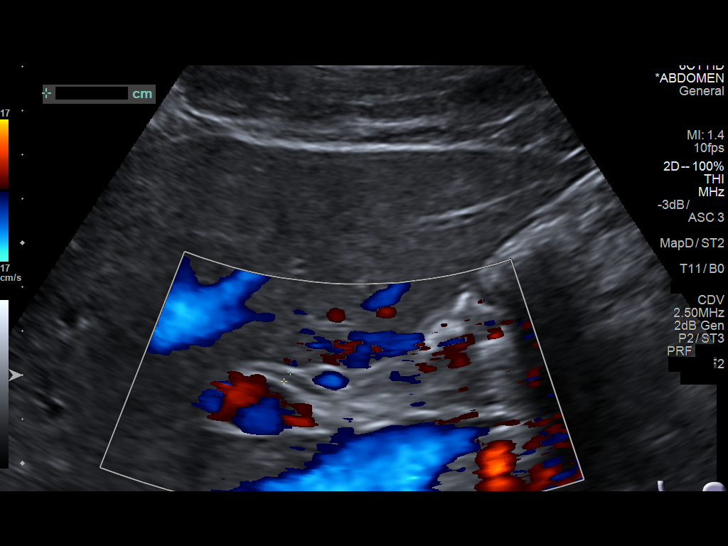
[im 26/76]
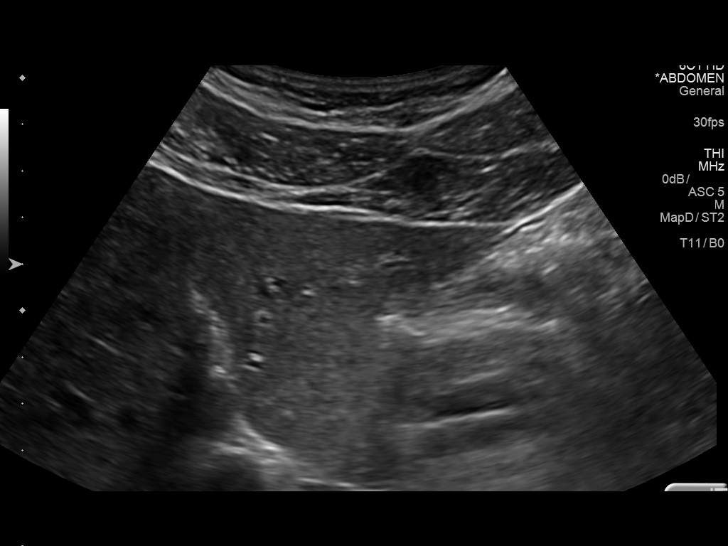
[im 29/76]
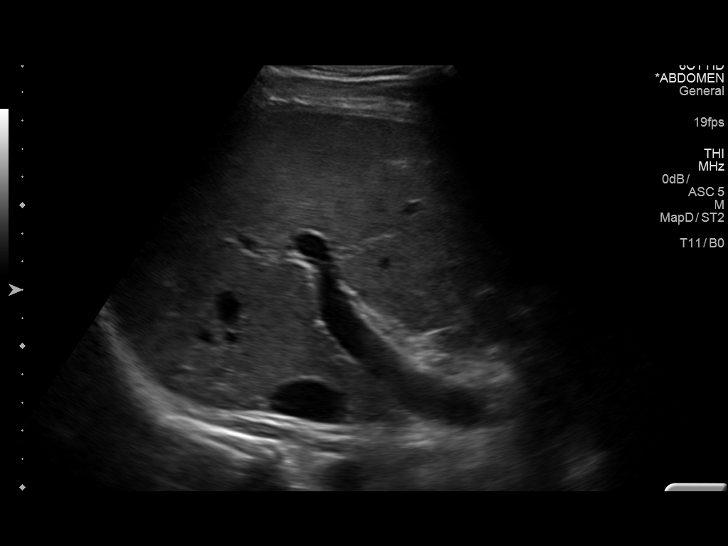
[im 35/76]
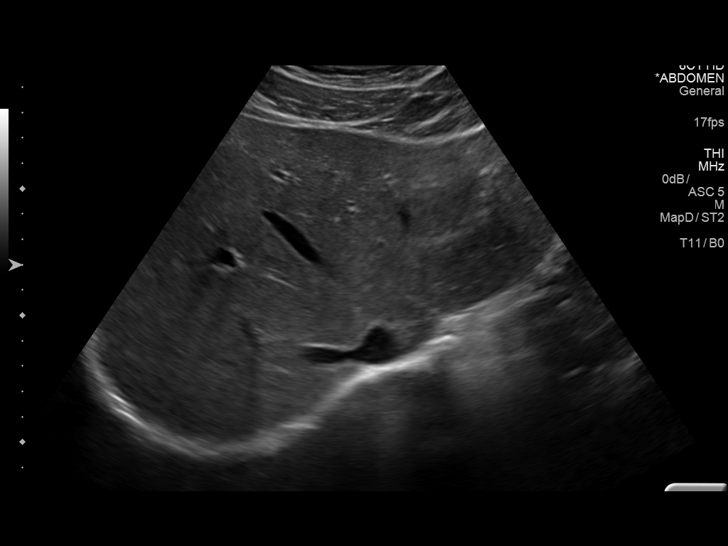
[im 41/76]
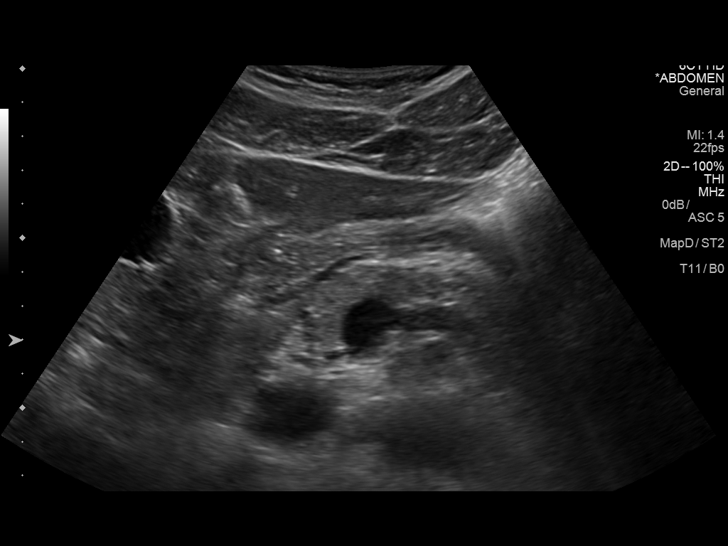
[im 47/76]
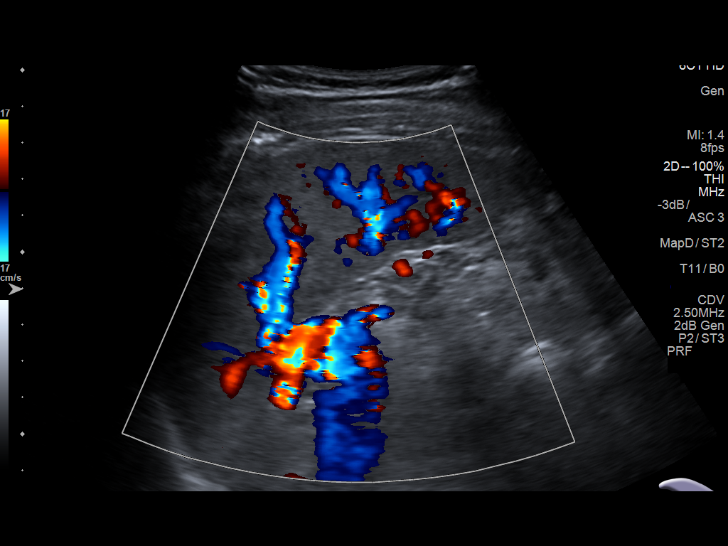
[im 51/76]
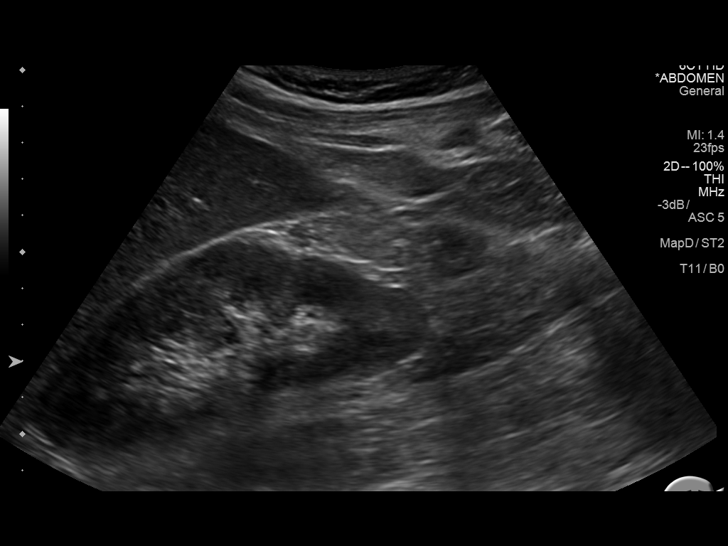
[im 57/76]
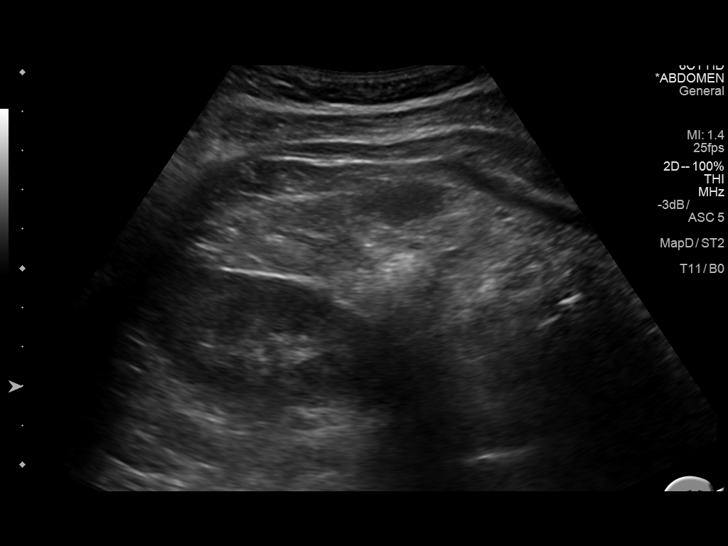
[im 63/76]
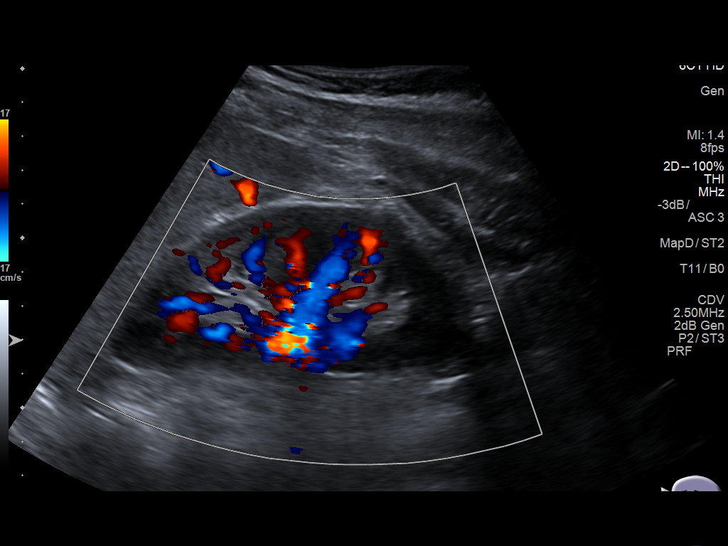
[im 69/76]
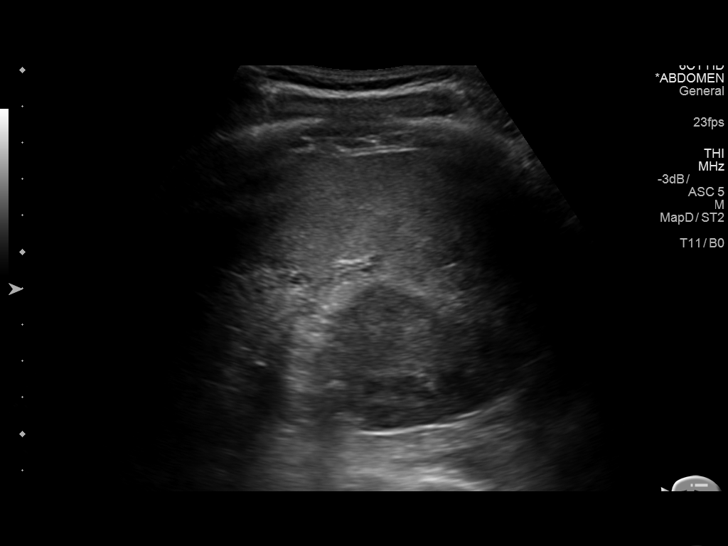
[im 76/76]
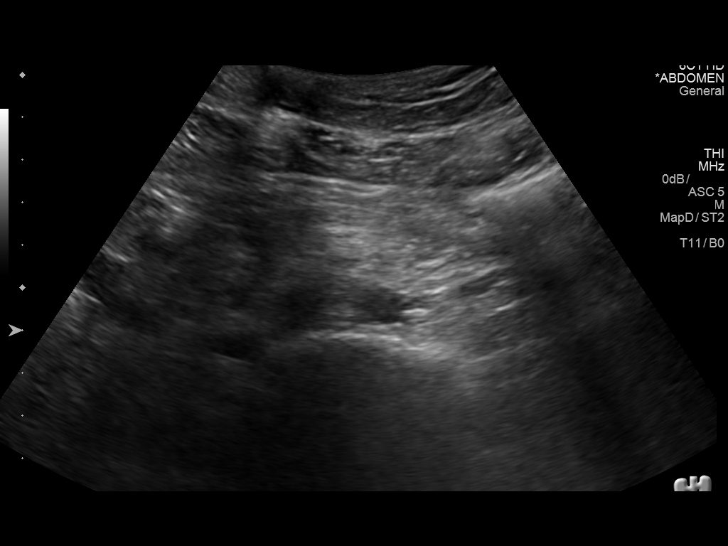

[14 of 25 positions shown; findings below may reference images not displayed]

FINDINGS: Gallbladder: No gallstones or wall thickening visualized. No
sonographic Murphy sign noted by sonographer.

Common bile duct: Diameter: 2.6 mm

Liver: No focal lesion identified. Within normal limits in
parenchymal echogenicity. Portal vein is patent on color Doppler
imaging with normal direction of blood flow towards the liver.

IVC: No abnormality visualized.

Pancreas: Visualized portion unremarkable.

Spleen: Size and appearance within normal limits.

Right Kidney: Length: 11.2 cm.. Echogenicity within normal limits.
No mass or hydronephrosis visualized.

Left Kidney: Length: 10.9 cm.. Echogenicity within normal limits. No
mass or hydronephrosis visualized.

Abdominal aorta: No aneurysm visualized.

Other findings: None.
IMPRESSION: No acute abnormality noted.

## 2018-10-11 IMAGING — DX DG ABDOMEN 1V
2 series · 2 of 2 positions shown · non-contrast
Comparison: None.

CLINICAL DATA: Right groin pain and right flank pain

EXAM:
ABDOMEN - 1 VIEW

[abdomen kub (1 of 2)]
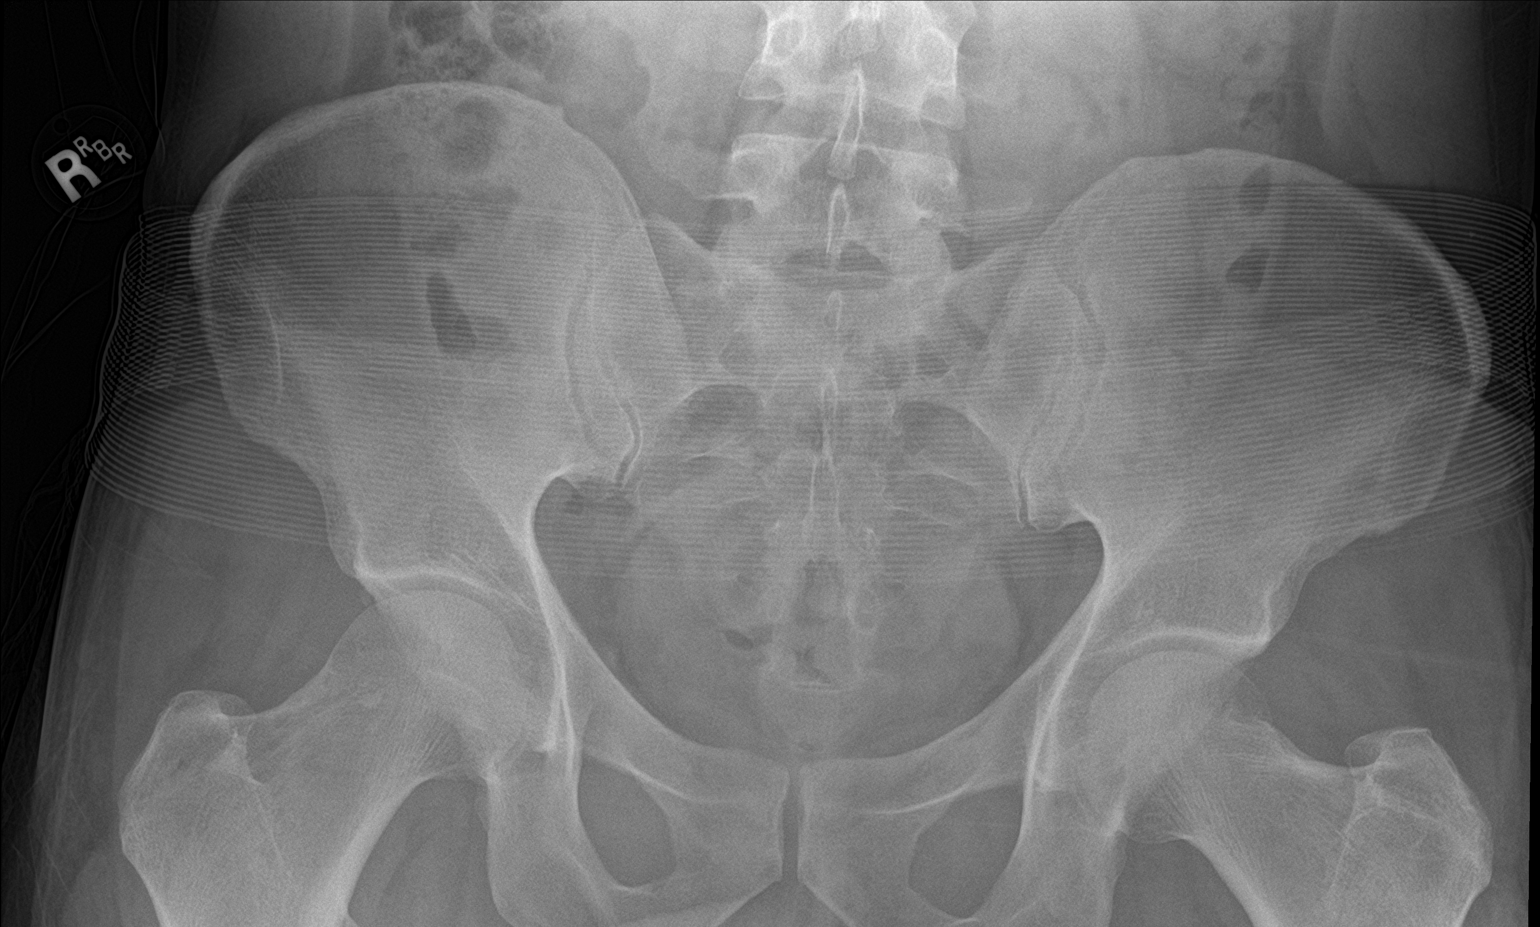

[abdomen kub (2 of 2)]
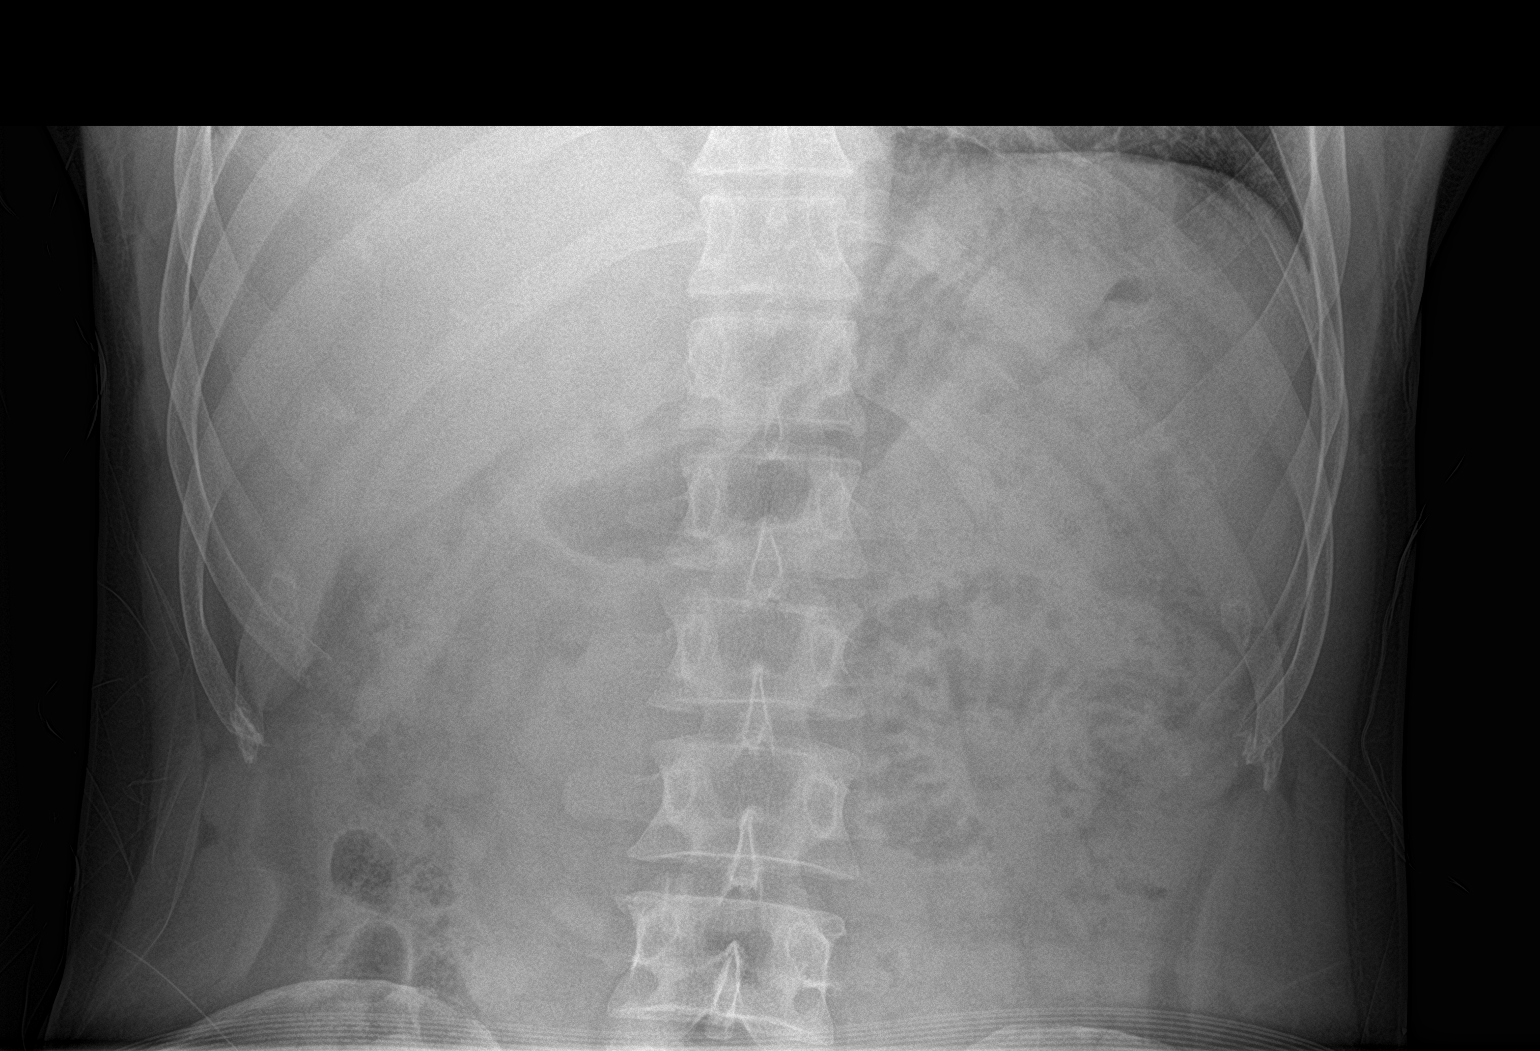

[2 of 2 positions shown; findings below may reference images not displayed]

FINDINGS: The bowel gas pattern is normal. No radio-opaque calculi or other
significant radiographic abnormality are seen.
IMPRESSION: Negative.

## 2018-11-04 ENCOUNTER — Encounter: Payer: Self-pay | Admitting: Family Medicine

## 2018-11-05 MED ORDER — SELENIUM SULFIDE 2.5 % EX LOTN: TOPICAL_LOTION | CUTANEOUS | 12 refills | Status: DC

## 2019-01-17 DIAGNOSIS — Z23 Encounter for immunization: Secondary | ICD-10-CM | POA: Diagnosis not present

## 2019-01-29 ENCOUNTER — Encounter: Payer: Self-pay | Admitting: Family Medicine

## 2019-01-29 DIAGNOSIS — Z111 Encounter for screening for respiratory tuberculosis: Secondary | ICD-10-CM

## 2019-03-04 ENCOUNTER — Encounter: Payer: Self-pay | Admitting: Family Medicine

## 2019-04-07 ENCOUNTER — Other Ambulatory Visit: Payer: Self-pay | Admitting: Family Medicine

## 2019-04-07 NOTE — Telephone Encounter (Signed)
Called patient and sent to schedule an appointment with scheduling. KG LPN

## 2019-04-07 NOTE — Telephone Encounter (Signed)
Needs appt

## 2019-04-08 ENCOUNTER — Telehealth: Payer: Self-pay | Admitting: Physician Assistant

## 2019-04-08 NOTE — Telephone Encounter (Signed)
Received fax for Modafinil sent through cover my meds and received authorization.  Valid: 04/08/2019 - 04/06/2022.   I will notify pharmacy - [facility]

## 2019-04-10 ENCOUNTER — Encounter: Payer: Self-pay | Admitting: Osteopathic Medicine

## 2019-04-10 ENCOUNTER — Other Ambulatory Visit: Payer: Self-pay

## 2019-04-10 ENCOUNTER — Other Ambulatory Visit: Payer: Self-pay | Admitting: Family Medicine

## 2019-04-10 ENCOUNTER — Ambulatory Visit (INDEPENDENT_AMBULATORY_CARE_PROVIDER_SITE_OTHER): Payer: BC Managed Care – PPO | Admitting: Osteopathic Medicine

## 2019-04-10 VITALS — HR 67 | Temp 98.2°F | Wt 173.0 lb

## 2019-04-10 DIAGNOSIS — E7800 Pure hypercholesterolemia, unspecified: Secondary | ICD-10-CM

## 2019-04-10 DIAGNOSIS — Z0000 Encounter for general adult medical examination without abnormal findings: Secondary | ICD-10-CM | POA: Diagnosis not present

## 2019-04-10 DIAGNOSIS — G4726 Circadian rhythm sleep disorder, shift work type: Secondary | ICD-10-CM | POA: Diagnosis not present

## 2019-04-10 DIAGNOSIS — Z8042 Family history of malignant neoplasm of prostate: Secondary | ICD-10-CM

## 2019-04-10 MED ORDER — MODAFINIL 200 MG PO TABS: 100.0000 mg | ORAL_TABLET | Freq: Every day | ORAL | 0 refills | Status: DC | PRN

## 2019-04-10 MED ORDER — KETOCONAZOLE 200 MG PO TABS
400.0000 mg | ORAL_TABLET | Freq: Every day | ORAL | 1 refills | Status: AC
Start: 2019-04-10 — End: 2019-04-11

## 2019-04-10 NOTE — Telephone Encounter (Signed)
Apolonio Schneiders this medication was approved - [facility]

## 2019-04-10 NOTE — Progress Notes (Signed)
Virtual Visit via phone, technical difficulties with [employer] call I connected with      Jesus Baker on 04/10/19 at 7:15 by a telemedicine application and verified that I am speaking with the correct person using two identifiers.  Patient is at home I am in office   I discussed the limitations of evaluation and management by telemedicine and the availability of in person appointments. The patient expressed understanding and agreed to proceed.  History of Present Illness: Jesus Baker is a 43 y.o. male who would like to discuss routien check-up/ labs, medication refills, rash   Very pleasant patient here to transition care from previous PCP who has since left the office.  He works as a rep for surgical equipment, occasionally has to be up really early in travel long distances so he takes Provigil as needed, typically splits the pill into half or quarter dose.  Needs refill on this medication.  Tinea versicolor has come back.  Had taken ketoconazole orally in the past, which worked really well for this.  Due for routine labs/screening.      Observations/Objective: Pulse 67   Temp 98.2 F (36.8 C) (Oral)   Wt 173 lb (78.5 kg)   SpO2 99%   BMI 26.30 kg/m  BP Readings from Last 3 Encounters:  12/28/17 134/86  10/24/17 134/88  02/20/17 (!) 143/68   Exam: Normal Speech.  NAD  Lab and Radiology Results No results found for this or any previous visit (from the past 72 hour(s)). No results found.     Assessment and Plan: 43 y.o. male with The primary encounter diagnosis was Annual physical exam. Diagnoses of Elevated LDL cholesterol level, Shift work sleep disorder, and Family history of prostate cancer were also pertinent to this visit.   PDMP not reviewed this encounter. Orders Placed This Encounter  Procedures  . CBC  . COMPLETE METABOLIC PANEL WITH GFR  . LIPID SCREENING  . QuantiFERON-TB Gold Plus  . PSA SOLSTAS   Meds ordered this encounter  Medications  .  modafinil (PROVIGIL) 200 MG tablet    Sig: Take 0.5-1 tablets (100-200 mg total) by mouth daily as needed.    Dispense:  30 tablet    Refill:  0    This request is for a new prescription for a controlled substance as required by Federal/State law.  . ketoconazole (NIZORAL) 200 MG tablet    Sig: Take 2 tablets (400 mg total) by mouth daily for 1 day.    Dispense:  2 tablet    Refill:  1   Patient Instructions   General Preventive Care  Most recent routine screening lipids/other labs: ordered today   Everyone should have blood pressure checked once per year.   Tobacco: don't!Alcohol: responsible moderation is ok for most adults - if you have concerns about your alcohol intake, please talk to me!   Exercise: as tolerated to reduce risk of cardiovascular disease and diabetes. Strength training will also prevent osteoporosis.   Mental health: if need for mental health care (medicines, counseling, other), or concerns about moods, please let me know!   Sexual health: if need for STD testing, or if concerns with libido/pain problems, please let me know!   Advanced Directive: Living Will and/or Healthcare Power of Attorney recommended for all adults, regardless of age or health.   Vaccines  Flu vaccine: recommended for almost everyone, every fall.   Shingles vaccine: Shingrix recommended after age 53.   Pneumonia vaccines: Prevnar and Pneumovax recommended  after age 4, or sooner if certain medical conditions.  Tetanus booster: Tdap recommended every 10 years.  Immunization History  Administered Date(s) Administered  . Hepatitis A 07/07/1999  . Hepatitis B 07/07/1999  . Influenza,inj,Quad PF,6+ Mos 12/24/2015, 12/28/2017  . Influenza-Unspecified 07/07/2015, 01/23/2017, 12/21/2018  . MMR 10/06/1993  . PPD Test 10/19/2015, 10/18/2016, 10/24/2017  . Tdap 07/07/2015  . Typhoid Live 04/03/2018  . Varicella 12/06/1988   Cancer screenings   Colon cancer screening: recommended for  everyone at age 76, but some folks need a colonoscopy sooner if risk factors.  Guidelines are changing such that most people will probably require screening around age 61.    Prostate cancer screening: PSA blood test around age 58 or earlier if family history   Lung cancer screening: not needed for non-smokers   Infection screenings . HIV: recommended screening at least once age 90-65, more often as needed. . Gonorrhea/Chlamydia: screening as needed . Hepatitis C: recommended for anyone born 44-1965 . TB: certain at-risk populations, or depending on work requirements and/or travel history  Other . Bone Density Test: recommended for men at age 53, sooner depending on risk factors . Abdominal Aortic Aneurysm: screening with ultrasound recommended once for men age 69-75 who have ever smoked         Instructions sent via [place]. If MyChart not available, pt was given option for info via personal e-mail w/ no guarantee of protected health info over unsecured e-mail communication, and MyChart sign-up instructions were sent to patient.   Follow Up Instructions: Return in about 1 year (around 04/09/2020) for [place] (call week prior to visit for lab orders) as long as current labs are looking ok!.    I discussed the assessment and treatment plan with the patient. The patient was provided an opportunity to ask questions and all were answered. The patient agreed with the plan and demonstrated an understanding of the instructions.   The patient was advised to call back or seek an in-person evaluation if any new concerns, if symptoms worsen or if the condition fails to improve as anticipated.  25 minutes of non-face-to-face time was provided during this encounter.      . . . . . . . . . . . . . Marland Kitchen                   Historical information moved to improve visibility of documentation.  No past medical history on file. Past Surgical History:  Procedure  Laterality Date  . KNEE ARTHROSCOPY    . KNEE ARTHROSCOPY W/ ACL RECONSTRUCTION     Social History   Tobacco Use  . Smoking status: Never Smoker  . Smokeless tobacco: Never Used  Substance Use Topics  . Alcohol use: Yes    Alcohol/week: 3.0 standard drinks    Types: 3 Standard drinks or equivalent per week   family history includes Cancer in his maternal grandfather; Hyperlipidemia in his father; Hypertension in his mother; Stroke in his maternal grandfather.  Medications: Current Outpatient Medications  Medication Sig Dispense Refill  . Cholecalciferol (VITAMIN D-3 PO) Take 1 capsule by mouth daily.    . Cholecalciferol (VITAMIN D3) 2400 UNIT/ML LIQD Take by mouth.    . magnesium oxide (MAG-OX) 400 MG tablet Take by mouth.    Marland Kitchen MAGNESIUM PO Take by mouth.    . modafinil (PROVIGIL) 200 MG tablet Take 0.5-1 tablets (100-200 mg total) by mouth daily as needed. 30 tablet 0  . Multiple Vitamin (MULTIVITAMIN) capsule  Take 1 capsule by mouth daily.    . Omega-3 Fatty Acids (FISH OIL) 1000 MG CAPS Take 1 capsule by mouth daily.    Marland Kitchen selenium sulfide (SELSUN) 2.5 % shampoo APPLY 1 APPLICATION TOPICALLY FOR 7 DAYS 118 mL 12  . ketoconazole (NIZORAL) 200 MG tablet Take 2 tablets (400 mg total) by mouth daily for 1 day. 2 tablet 1  . Nutritional Supplements (CALCIUM D-GLUCARATE PO) Take 1 capsule by mouth daily.    Marland Kitchen omega-3 acid ethyl esters (LOVAZA) 1 g capsule Take by mouth.    . typhoid (VIVOTIF) DR capsule Take 1 capsule by mouth every other day. (Patient not taking: Reported on 04/10/2019) 4 capsule 0   No current facility-administered medications for this visit.    Allergies  Allergen Reactions  . Poison Ivy Extract [Poison Ivy Extract]   . [place] Extract]   . [place]

## 2019-04-10 NOTE — Patient Instructions (Addendum)
Return in about 1 year (around 04/09/2020) for [place] (call week prior to visit for lab orders) as long as current labs are looking ok!.   General Preventive Care  Most recent routine screening lipids/other labs: ordered today   Everyone should have blood pressure checked once per year.   Tobacco: don't!Alcohol: responsible moderation is ok for most adults - if you have concerns about your alcohol intake, please talk to me!   Exercise: as tolerated to reduce risk of cardiovascular disease and diabetes. Strength training will also prevent osteoporosis.   Mental health: if need for mental health care (medicines, counseling, other), or concerns about moods, please let me know!   Sexual health: if need for STD testing, or if concerns with libido/pain problems, please let me know!   Advanced Directive: Living Will and/or Healthcare Power of Attorney recommended for all adults, regardless of age or health.   Vaccines  Flu vaccine: recommended for almost everyone, every fall.   Shingles vaccine: Shingrix recommended after age 98.   Pneumonia vaccines: Prevnar and Pneumovax recommended after age 74, or sooner if certain medical conditions.  Tetanus booster: Tdap recommended every 10 years.  Immunization History  Administered Date(s) Administered  . Hepatitis A 07/07/1999  . Hepatitis B 07/07/1999  . Influenza,inj,Quad PF,6+ Mos 12/24/2015, 12/28/2017  . Influenza-Unspecified 07/07/2015, 01/23/2017, 12/21/2018  . MMR 10/06/1993  . PPD Test 10/19/2015, 10/18/2016, 10/24/2017  . Tdap 07/07/2015  . Typhoid Live 04/03/2018  . Varicella 12/06/1988   Cancer screenings   Colon cancer screening: recommended for everyone at age 41, but some folks need a colonoscopy sooner if risk factors.  Guidelines are changing such that most people will probably require screening around age 20.    Prostate cancer screening: PSA blood test around age 60 or earlier if family history   Lung cancer  screening: not needed for non-smokers   Infection screenings . HIV: recommended screening at least once age 76-65, more often as needed. . Gonorrhea/Chlamydia: screening as needed . Hepatitis C: recommended for anyone born 34-1965 . TB: certain at-risk populations, or depending on work requirements and/or travel history  Other . Bone Density Test: recommended for men at age 28, sooner depending on risk factors . Abdominal Aortic Aneurysm: screening with ultrasound recommended once for men age 74-75 who have ever smoked

## 2019-04-10 NOTE — Telephone Encounter (Signed)
Visit in progress.

## 2019-06-23 ENCOUNTER — Encounter: Payer: Self-pay | Admitting: Osteopathic Medicine

## 2019-08-11 DIAGNOSIS — D234 Other benign neoplasm of skin of scalp and neck: Secondary | ICD-10-CM | POA: Diagnosis not present

## 2019-08-11 DIAGNOSIS — B360 Pityriasis versicolor: Secondary | ICD-10-CM | POA: Diagnosis not present

## 2019-08-11 DIAGNOSIS — D2239 Melanocytic nevi of other parts of face: Secondary | ICD-10-CM | POA: Diagnosis not present

## 2019-08-11 DIAGNOSIS — Q825 Congenital non-neoplastic nevus: Secondary | ICD-10-CM | POA: Diagnosis not present

## 2019-10-28 ENCOUNTER — Other Ambulatory Visit: Payer: Self-pay

## 2019-10-28 ENCOUNTER — Encounter: Payer: Self-pay | Admitting: Nurse Practitioner

## 2019-10-28 ENCOUNTER — Ambulatory Visit (INDEPENDENT_AMBULATORY_CARE_PROVIDER_SITE_OTHER): Payer: BC Managed Care – PPO | Admitting: Nurse Practitioner

## 2019-10-28 VITALS — Temp 97.6°F | Ht 66.0 in | Wt 177.0 lb

## 2019-10-28 DIAGNOSIS — Z823 Family history of stroke: Secondary | ICD-10-CM

## 2019-10-28 DIAGNOSIS — R030 Elevated blood-pressure reading, without diagnosis of hypertension: Secondary | ICD-10-CM

## 2019-10-28 DIAGNOSIS — R42 Dizziness and giddiness: Secondary | ICD-10-CM

## 2019-10-28 DIAGNOSIS — R1011 Right upper quadrant pain: Secondary | ICD-10-CM

## 2019-10-28 DIAGNOSIS — Z0189 Encounter for other specified special examinations: Secondary | ICD-10-CM

## 2019-10-28 MED ORDER — HYDROCHLOROTHIAZIDE 12.5 MG PO TABS: 12.5000 mg | ORAL_TABLET | Freq: Every day | ORAL | 3 refills | Status: DC

## 2019-10-28 NOTE — Patient Instructions (Signed)
Helicobacter Pylori Infection Helicobacter pylori infection is a bacterial infection in the stomach. Long-term (chronic) infection can cause stomach irritation (gastritis), ulcers in the stomach (gastric ulcers), and ulcers in the upper part of the intestine (duodenal ulcers). Having this infection may also increase your risk of stomach cancer and a type of white blood cell cancer (lymphoma) that affects the stomach. What are the causes? This infection is caused by the Helicobacter pylori (H. pylori) bacteria. Many healthy people have this bacteria in their stomach lining. The bacteria may also spread from person to person through contact with stool (feces) or saliva. It is not known why some people develop ulcers, gastritis, or cancer from the bacteria. What increases the risk? You are more likely to develop this condition if you:  Have family members with the infection.  Live with many other people, such as in a dormitory.  Are of African, Hispanic, or Asian descent. What are the signs or symptoms? Most people with this infection do not have any symptoms. If you do have symptoms, they may include:  Heartburn.  Stomach pain.  Nausea.  Vomiting. The vomit may be bloody because of ulcers.  Loss of appetite.  Bad breath. How is this diagnosed? This condition may be diagnosed based on:  Your symptoms and medical history.  A physical exam.  Blood tests.  Stool tests.  A breath test.  A procedure that involves placing a tube with a camera on the end of it down your throat to examine your stomach and upper intestine (upper endoscopy).  Removing and testing a tissue sample from the stomach lining (biopsy). A biopsy may be taken during an upper endoscopy. How is this treated?  This condition is treated by taking a combination of medicines (triple therapy) for several weeks. Triple therapy includes one medicine to reduce the amount of acid in your stomach and two types of  antibiotic medicines. This treatment may reduce your risk of cancer. You may need to be tested for H. pylori again after treatment. In some cases, the treatment may need to be repeated if your treatment did not get rid of all the bacteria. Follow these instructions at home:   Take over-the-counter and prescription medicines only as told by your health care provider.  Take your antibiotics as told by your health care provider. Do not stop taking the antibiotics even if you start to feel better.  Return to your normal activities as told by your health care provider. Ask your health care provider what activities are safe for you.  Take steps to prevent future infections: ? Wash your hands often with soap and water. If soap and water are not available, use hand sanitizer. ? Do not eat food or drink water that may have had contact with stool or saliva.  Keep all follow-up visits as told by your health care provider. This is important. You may need tests to make sure your treatment worked. Contact a health care provider if your symptoms:  Do not get better with treatment.  Return after treatment. Summary  Helicobacter pylori infection is a stomach infection caused by the Helicobacter pylori (H. pylori) bacteria.  This infection can cause stomach irritation (gastritis), ulcers in the stomach (gastric ulcers), and ulcers in the upper part of the intestine (duodenal ulcers).  This condition is treated by taking a combination of medicines (triple therapy) for several weeks.  Take your antibiotics as told by your health care provider. Do not stop taking the antibiotics even if   you start to feel better. This information is not intended to replace advice given to you by your health care provider. Make sure you discuss any questions you have with your health care provider. Document Revised: 08/15/2018 Document Reviewed: 04/17/2017 [employer] Patient Education  2020 [employer]  

## 2019-10-28 NOTE — Progress Notes (Signed)
Acute Office Visit  Subjective:    Patient ID: Jesus Baker, male    DOB: [birth year], 44 y.o.   MRN: [id number]  Chief Complaint  Patient presents with  . Dizziness    last 3 days has had intermittent dizziness, slightly confused, works in [place], denies nausea, slight pressure  . Abdominal Pain    RUQ pain, had UGI scope that showed positive for h. pylori, is wondering if he needs to be tested again    HPI  DIZZINESS Patient is in today for new onset of symptoms of sudden, intermittent dizziness, without loss of balance or vertigo, with no known cause. He reports he has experienced several episodes over the past few days and is unable to pinpoint a causative factor. The episodes resolve spontaneously within a few seconds to minutes. He reports the sensation is like "I am on a drug". He does notice a very slight headache (1-2/10) in the left occipital region when these occur. He also reports a sensation of mental confusion during these episodes that passes quickly. He wears a smart watch and does not have any low or high heart rate readings or abnormal EKG. His heart rate is fairly consistent in the upper 50's. He exercises daily and follows a fairly healthy diet. He reports he is well hydrated. He works in the OR and does stand for long periods of time.   He denies increased incidence or exacerbation with head movement or motion. He denies shortness of breath, changes in vision, chest pain, severe head pain, recent upper respiratory illness, anxiety, or changes in hearing.   BLOOD PRESSURE He does report that his blood pressure has been more elevated in the recent months than in the past. He utilizes a controlled blood pressure monitor to check this intermittently. His most recent readings have been in the upper 130's to lower 140's over 80's. He does report his blood pressure is typically higher in the office.  He is unsure if his recent dizzy spells could be blood pressure related.   RUQ PAIN He  has also been experiencing RUQ abdominal pain over the past two years. An abdominal ultrasound in 2019 showed no evidence of gallbladder dysfunction and a barium swallow did not show any dysmotility. He had an upper GI in 2020 which did reveal the presence of H. Pylori infection and he was treated for that. He reports that the treatment was altered a bit as not all of the medication was available and he was going out of the country shortly after treatment. He is concerned that he did not have complete resolution of the H. Pylori and would like to be tested again to determine if this could be the remaining cause of his RUQ pain.   He reports he eats a relatively healthy, but spicy diet. He does not feel that food makes his symptoms worse. He denies changes to his bowel habits, loose stools, nausea, or vomiting.   History reviewed. No pertinent past medical history.  Past Surgical History:  Procedure Laterality Date  . KNEE ARTHROSCOPY    . KNEE ARTHROSCOPY W/ ACL RECONSTRUCTION      Family History  Problem Relation Age of Onset  . Cancer Maternal Grandfather        melanoma  . Stroke Maternal Grandfather   . Hyperlipidemia Father   . Hypertension Mother     Social History   Socioeconomic History  . Marital status: Married    Spouse name: Lorene Dy  . Number of children:  Not on file  . Years of education: 6716  . Highest education level: Not on file  Occupational History  . Occupation: self employed  Tobacco Use  . Smoking status: Never Smoker  . Smokeless tobacco: Never Used  Substance and Sexual Activity  . Alcohol use: Yes    Alcohol/week: 3.0 standard drinks    Types: 3 Standard drinks or equivalent per week  . Drug use: No  . Sexual activity: Yes    Partners: Female  Other Topics Concern  . 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 He is married with 3 children 17,5,2. Exercises 60 min 4 days a week.Heath GoldBarkley, Tonya Lynetta    Social Determinants of Health   Financial Resource Strain:   . Difficulty of Paying Living Expenses:   Food Insecurity:   . Worried About Programme researcher, broadcasting/film/videounning Out of Food in the Last Year:   . Baristaan Out of Food in the Last Year:   Transportation Needs:   . Freight forwarderLack of Transportation (Medical):   Marland Kitchen. Lack of Transportation (Non-Medical):   Physical Activity:   . Days of Exercise per Week:   . Minutes of Exercise per Session:   Stress:   . Feeling of Stress :   Social Connections:   . Frequency of Communication with Friends and Family:   . Frequency of Social Gatherings with Friends and Family:   . Attends Religious Services:   . Active Member of Clubs or Organizations:   . Attends BankerClub or Organization Meetings:   Marland Kitchen. Marital Status:   Intimate Partner Violence:   . Fear of Current or Ex-Partner:   . Emotionally Abused:   Marland Kitchen. Physically Abused:   . Sexually Abused:     Outpatient Medications Prior to Visit  Medication Sig Dispense Refill  . Cholecalciferol (VITAMIN D-3 PO) Take 1 capsule by mouth daily.    Marland Kitchen. MAGNESIUM PO Take by mouth.    . modafinil (PROVIGIL) 200 MG tablet Take 0.5-1 tablets (100-200 mg total) by mouth daily as needed. 30 tablet 0  . Multiple Vitamin (MULTIVITAMIN) capsule Take 1 capsule by mouth daily.    . Omega-3 Fatty Acids (FISH OIL) 1000 MG CAPS Take 1 capsule by mouth daily.    Marland Kitchen. selenium sulfide (SELSUN) 2.5 % shampoo APPLY 1 APPLICATION TOPICALLY FOR 7 DAYS 118 mL 12  . magnesium oxide (MAG-OX) 400 MG tablet Take by mouth.    . Cholecalciferol (VITAMIN D3) 2400 UNIT/ML LIQD Take by mouth.    . Nutritional Supplements (CALCIUM D-GLUCARATE PO) Take 1 capsule by mouth daily.    Marland Kitchen. omega-3 acid ethyl esters (LOVAZA) 1 g capsule Take by mouth.    . typhoid (VIVOTIF) DR capsule Take 1 capsule by mouth every other day. (Patient not taking: Reported on 04/10/2019) 4 capsule 0   No facility-administered medications prior to visit.    Allergies  Allergen Reactions  . Poison  Ivy Extract [Poison Ivy Extract]   . Poison Oak Extract [employer][Poison Oak Extract]   . Sumac     Review of Systems  Constitutional: Negative for activity change, appetite change, chills, fatigue, fever and unexpected weight change.  HENT: Negative for congestion, ear pain, postnasal drip, rhinorrhea, sinus pressure, sinus pain, sore throat and trouble swallowing.   Eyes: Negative for visual disturbance.  Respiratory: Negative for cough, chest tightness and shortness of breath.   Cardiovascular: Negative for chest pain, palpitations and leg swelling.  Gastrointestinal: Positive for abdominal pain. Negative for  blood in stool, constipation, diarrhea, nausea and vomiting.  Musculoskeletal: Negative for gait problem, neck pain and neck stiffness.  Skin: Positive for pallor.  Neurological: Positive for dizziness and headaches. Negative for tremors, syncope, facial asymmetry, speech difficulty, weakness, light-headedness and numbness.  Psychiatric/Behavioral: Positive for confusion. Negative for decreased concentration and sleep disturbance. The patient is not nervous/anxious.        Objective:    Physical Exam Vitals and nursing note reviewed.  Constitutional:      General: He is not in acute distress.    Appearance: He is well-developed and normal weight. He is not ill-appearing.  HENT:     Head: Normocephalic and atraumatic.     Right Ear: Hearing, tympanic membrane and ear canal normal.     Left Ear: Hearing, tympanic membrane and ear canal normal.  Eyes:     General: Lids are normal. Vision grossly intact. Gaze aligned appropriately. No visual field deficit.    Extraocular Movements: Extraocular movements intact.     Right eye: Normal extraocular motion and no nystagmus.     Left eye: Normal extraocular motion and no nystagmus.     Pupils: Pupils are equal, round, and reactive to light.     Funduscopic exam:    Right eye: No hemorrhage or papilledema. Red reflex present.        Left  eye: No hemorrhage or papilledema. Red reflex present. Neck:     Vascular: No carotid bruit.  Cardiovascular:     Rate and Rhythm: Normal rate and regular rhythm.     Heart sounds: Normal heart sounds. No murmur heard.  No friction rub. No gallop.   Pulmonary:     Effort: Pulmonary effort is normal. No respiratory distress.     Breath sounds: Normal breath sounds. No wheezing or rhonchi.  Abdominal:     General: Abdomen is flat. Bowel sounds are normal. There is no distension or abdominal bruit.     Palpations: Abdomen is soft. There is no shifting dullness, fluid wave, hepatomegaly, splenomegaly or pulsatile mass.     Tenderness: There is abdominal tenderness in the right upper quadrant. There is no guarding or rebound.     Hernia: No hernia is present.  Musculoskeletal:     Cervical back: Full passive range of motion without pain and normal range of motion. No rigidity. No pain with movement, spinous process tenderness or muscular tenderness. Normal range of motion.     Right lower leg: No edema.     Left lower leg: No edema.  Lymphadenopathy:     Cervical: No cervical adenopathy.  Skin:    General: Skin is warm and dry.     Capillary Refill: Capillary refill takes less than 2 seconds.     Coloration: Skin is not cyanotic, mottled or pale.  Neurological:     General: No focal deficit present.     Mental Status: He is alert and oriented to person, place, and time.     Cranial Nerves: No cranial nerve deficit, dysarthria or facial asymmetry.     Sensory: No sensory deficit.     Motor: No weakness.     Coordination: Coordination is intact.     Gait: Gait is intact.  Psychiatric:        Attention and Perception: Attention and perception normal.        Mood and Affect: Mood normal.        Speech: Speech normal.        Behavior: Behavior  normal.        Thought Content: Thought content normal.        Cognition and Memory: Cognition and memory normal.        Judgment: Judgment  normal.     Ht 5\' 6"  (1.676 m)   Wt 177 lb (80.3 kg)   BMI 28.57 kg/m  Wt Readings from Last 3 Encounters:  10/28/19 177 lb (80.3 kg)  04/10/19 173 lb (78.5 kg)  12/28/17 172 lb (78 kg)    Health Maintenance Due  Topic Date Due  . Hepatitis C Screening  Never done    There are no preventive care reminders to display for this patient.   No results found for: TSH Lab Results  Component Value Date   WBC 4.3 12/28/2017   HGB 15.9 12/28/2017   HCT 46.3 12/28/2017   MCV 89.7 12/28/2017   PLT 139 (L) 12/28/2017   Lab Results  Component Value Date   NA 139 12/28/2017   K 4.5 12/28/2017   CO2 28 12/28/2017   GLUCOSE 86 12/28/2017   BUN 21 12/28/2017   CREATININE 1.25 12/28/2017   BILITOT 0.6 12/28/2017   ALKPHOS 62 06/21/2016   AST 22 12/28/2017   ALT 27 12/28/2017   PROT 6.9 12/28/2017   ALBUMIN 4.6 06/21/2016   CALCIUM 9.6 12/28/2017   Lab Results  Component Value Date   CHOL 226 (H) 12/28/2017   Lab Results  Component Value Date   HDL 47 12/28/2017   Lab Results  Component Value Date   LDLCALC 161 (H) 12/28/2017   Lab Results  Component Value Date   TRIG 76 12/28/2017   Lab Results  Component Value Date   CHOLHDL 4.8 12/28/2017   No results found for: HGBA1C     Assessment & Plan:   1. RUQ abdominal pain RUQ quadrant pain, ongoing, in the setting of a history of H. Pylori infection. Based on the patients description, it is possible full eradication of the bacteria was not complete with last treatment. At this time, I feel that it is reasonable to perform a breath test. Further evaluation may be necessary if the test is inconclusive. He has had a normal ultrasound when similar symptoms were present in the past, but this may be necessary to repeat if symptoms persist despite effective treatment for H. Pylori.   PLAN: -Will obtain labs for CBC, CMP, Hepatitis C, Lipids, and TSH -H. Pylori test in the office tomorrow with nurse visit - Follow-up  plan depending on results of testing. Will notify patient of changes to the plan of care once results are received.   - CBC with Differential/Platelet - H. pylori breath test - Hepatitis C antibody - COMPLETE METABOLIC PANEL WITH GFR - TSH  2. Elevated blood pressure reading in office without diagnosis of hypertension Elevated blood pressure readings out of the office and in the office today. Patient does have a strong family history of hypertension. Given the new onset of intermittent dizziness and mild headache in the setting of elevated blood pressure readings, we will start low dose HCTZ today. Orthostatics performed today were consistent in BP and HR. Discussed with the patient to continue to monitor his blood pressure at home. We will plan to follow-up in 2 weeks to see if his blood pressure has improved and if the symptoms of dizziness and headache have improved. Orthostatic blood pressures today were in the 150's/90's with HR in the mid to upper 50's. No significant variation was detected  between lying, sitting, and standing.   PLAN: - Start HCTZ 12.5mg  daily - Monitor BP at home - Follow-up in 2 weeks for BP  - hydrochlorothiazide (HYDRODIURIL) 12.5 MG tablet; Take 1 tablet (12.5 mg total) by mouth daily.  Dispense: 30 tablet; Refill: 3  3. Dizziness of unknown cause New onset of intermittent dizziness with reported mild headache and mental clouding for the past several days. The true cause of this is not clear today. Will check basic labs to rule out contributing factors. There is no evidence nystagmus or cardiac changes that may be contributing to these symptoms. He does have elevated blood pressure readings in the office, however, orthostatics showed no variation in BP or HR. His pulse runs fairly consistently in the upper 50's at rest, as a physically active male this is appropriate. We did discuss the option of cardiology referral for continuous heart monitoring to determine if this  could be the cause of his symptoms. A joint decision was made to defer this until we determine if the elevated blood pressure could be a causative factor and will readdress this in 2 weeks at the BP follow-up. If symptoms persist, I feel that an in office EKG and cardiology referral may be warranted. We may also consider imaging of the head given the symptoms of mild headache, although, this is a less likely cause given he has no other symptoms.  PLAN: -Start HCTZ 12.5mg  for elevated blood pressure -Keep record of events and situations surrounding these events to determine if a causative factor can be identified. -EKG at next office visit and probably cardiology referral if symptoms persist -Consider head CT if symptoms persist -Follow-up in 2 weeks  - CBC with Differential/Platelet - COMPLETE METABOLIC PANEL WITH GFR - TSH  4. Encounter for routine laboratory testing Routine laboratory testing including CBC, CMP, Hepatitis C, TSH, and lipids. Quantiferon test added due to work exposure to possible TB.   PLAN: -Will notify patient of laboratory results and make changes to the plan of care based on results as needed.   - CBC with Differential/Platelet - Hepatitis C antibody - QuantiFERON-TB Gold Plus - COMPLETE METABOLIC PANEL WITH GFR - TSH   Orma Render, NP

## 2019-10-29 DIAGNOSIS — Z0189 Encounter for other specified special examinations: Secondary | ICD-10-CM | POA: Diagnosis not present

## 2019-10-29 DIAGNOSIS — R1011 Right upper quadrant pain: Secondary | ICD-10-CM | POA: Diagnosis not present

## 2019-10-29 DIAGNOSIS — R42 Dizziness and giddiness: Secondary | ICD-10-CM | POA: Diagnosis not present

## 2019-10-29 DIAGNOSIS — Z823 Family history of stroke: Secondary | ICD-10-CM | POA: Diagnosis not present

## 2019-10-29 DIAGNOSIS — R030 Elevated blood-pressure reading, without diagnosis of hypertension: Secondary | ICD-10-CM | POA: Diagnosis not present

## 2019-10-30 ENCOUNTER — Encounter: Payer: Self-pay | Admitting: Nurse Practitioner

## 2019-10-31 ENCOUNTER — Encounter: Payer: Self-pay | Admitting: Nurse Practitioner

## 2019-10-31 LAB — CBC WITH DIFFERENTIAL/PLATELET
Absolute Monocytes: 312 cells/uL (ref 200–950)
Basophils Absolute: 21 cells/uL (ref 0–200)
Basophils Relative: 0.5 %
Eosinophils Absolute: 98 cells/uL (ref 15–500)
Eosinophils Relative: 2.4 %
HCT: 52.1 % — ABNORMAL HIGH (ref 38.5–50.0)
Hemoglobin: 17.9 g/dL — ABNORMAL HIGH (ref 13.2–17.1)
Lymphs Abs: 1771 cells/uL (ref 850–3900)
MCH: 31.6 pg (ref 27.0–33.0)
MCHC: 34.4 g/dL (ref 32.0–36.0)
MCV: 92 fL (ref 80.0–100.0)
MPV: 9.4 fL (ref 7.5–12.5)
Monocytes Relative: 7.6 %
Neutro Abs: 1898 cells/uL (ref 1500–7800)
Neutrophils Relative %: 46.3 %
Platelets: 164 10*3/uL (ref 140–400)
RBC: 5.66 10*6/uL (ref 4.20–5.80)
RDW: 12.3 % (ref 11.0–15.0)
Total Lymphocyte: 43.2 %
WBC: 4.1 10*3/uL (ref 3.8–10.8)

## 2019-10-31 LAB — COMPLETE METABOLIC PANEL WITH GFR
AG Ratio: 2 (calc) (ref 1.0–2.5)
ALT: 22 U/L (ref 9–46)
AST: 20 U/L (ref 10–40)
Albumin: 5.2 g/dL — ABNORMAL HIGH (ref 3.6–5.1)
Alkaline phosphatase (APISO): 65 U/L (ref 36–130)
BUN: 16 mg/dL (ref 7–25)
CO2: 26 mmol/L (ref 20–32)
Calcium: 9.8 mg/dL (ref 8.6–10.3)
Chloride: 99 mmol/L (ref 98–110)
Creat: 1.17 mg/dL (ref 0.60–1.35)
GFR, Est African American: 88 mL/min/{1.73_m2} (ref >=60)
GFR, Est Non African American: 76 mL/min/{1.73_m2} (ref >=60)
Globulin: 2.6 g/dL (calc) (ref 1.9–3.7)
Glucose, Bld: 81 mg/dL (ref 65–99)
Potassium: 4.3 mmol/L (ref 3.5–5.3)
Sodium: 137 mmol/L (ref 135–146)
Total Bilirubin: 0.7 mg/dL (ref 0.2–1.2)
Total Protein: 7.8 g/dL (ref 6.1–8.1)

## 2019-10-31 LAB — SPECIMEN COMPROMISED

## 2019-10-31 LAB — QUANTIFERON-TB GOLD PLUS

## 2019-10-31 LAB — HEPATITIS C ANTIBODY
Hepatitis C Ab: NONREACTIVE
SIGNAL TO CUT-OFF: 0.01 (ref <1.00)

## 2019-10-31 LAB — TSH: TSH: 4.44 mIU/L (ref 0.40–4.50)

## 2019-10-31 NOTE — Progress Notes (Signed)
Hi Trigg,   Most of your labs have resulted so I wanted to send a message before the weekend.  Your CBC looks good. Just very slight elevation in hemoglobin and hematocrit, but nothing to be concerned about.  Hepatitis C was negative! Renee Rival! Your Metabolic panel looks good, too. Nothing concerned with your kidney function, electrolytes, protein, or liver function.  Your thyroid hormone levels are normal.  As discussed before, the H. Pylori was normal.   We are still waiting on the TB test and the lipid panel to result. I will send you a different note when those result.   Let me know if you have any questions.  SaraBeth

## 2019-11-03 NOTE — Telephone Encounter (Signed)
Spoke with [facility], patient is going to be recollected at no cost for TB screen since test could not be performed on sample provided.   FYI to [facility]

## 2019-11-04 LAB — CBC WITH DIFFERENTIAL/PLATELET

## 2019-11-04 LAB — HEPATITIS C ANTIBODY

## 2019-11-04 LAB — TSH

## 2019-11-04 LAB — H. PYLORI BREATH TEST: H. pylori Breath Test: NOT DETECTED

## 2019-11-04 LAB — QUANTIFERON-TB GOLD PLUS

## 2019-11-04 LAB — COMPLETE METABOLIC PANEL WITH GFR

## 2019-11-04 NOTE — Telephone Encounter (Signed)
It appears that there were two tests that did not result on Jesus Baker. The quanteferon gold and the Cardio Q cholesterol test. Can we check to see what happened here and if he needs to come in for a recollect? Also, can we get a breakdown of what they charged him? It looks like Dr. Lyn Hollingshead had old orders in place that they have marked "cancelled" for duplicate, but I want to make sure he wasn't charged for both sets of testing. If I need to send this to someone else, please let me know.

## 2019-11-04 NOTE — Telephone Encounter (Signed)
I called [facility] for the status, they state that the Cardio is actually pending, they are waiting for the Lipo Protein to results. I will keep you updated

## 2019-11-05 ENCOUNTER — Encounter: Payer: Self-pay | Admitting: Nurse Practitioner

## 2019-11-05 ENCOUNTER — Other Ambulatory Visit: Payer: Self-pay

## 2019-11-05 DIAGNOSIS — E7800 Pure hypercholesterolemia, unspecified: Secondary | ICD-10-CM

## 2019-11-05 LAB — CARDIO IQ(R) ADVANCED LIPID PANEL
Apolipoprotein B: 128 mg/dL — ABNORMAL HIGH (ref <90)
Cholesterol: 262 mg/dL — ABNORMAL HIGH (ref <200)
HDL: 54 mg/dL (ref >39)
LDL Cholesterol (Calc): 191 mg/dL (calc) — ABNORMAL HIGH (ref <100)
LDL Large: 5290 nmol/L — ABNORMAL LOW (ref >6729)
LDL Medium: 376 nmol/L — ABNORMAL HIGH (ref <215)
LDL Particle Number: 1866 nmol/L — ABNORMAL HIGH (ref <1138)
LDL Peak Size: 210.9 Angstrom — ABNORMAL LOW (ref >222.9)
LDL Small: 486 nmol/L — ABNORMAL HIGH (ref <142)
Lipoprotein (a): 13 nmol/L (ref <75)
Non-HDL Cholesterol (Calc): 208 mg/dL (calc) — ABNORMAL HIGH (ref <130)
Total CHOL/HDL Ratio: 4.9 calc — ABNORMAL HIGH (ref <3.6)
Triglycerides: 66 mg/dL (ref <150)

## 2019-11-05 NOTE — Progress Notes (Signed)
Cardio IQ test shows that your HDL (good cholesterol) is ok, but your LDL (and more specifically apolipoprotein B) is elevated. The optimal level of the apolipoprotein B is less than 90, yours is 128, which is in the "high" range. This puts you at increased risk for atherosclerosis and cardiovascular disease.   We have a couple of options with this. You can work on dietary measures by cutting out fatty foods, specifically those high in saturated fats and we can repeat the test in 6 months to see how you are doing. Or we can start you on a low dose lipid lowering medication. If you have a family history of cardiovascular disease at a younger age, I recommend statin therapy as opposed to waiting. Let me know your thoughts and we can go from there.

## 2019-11-28 ENCOUNTER — Encounter: Payer: Self-pay | Admitting: Osteopathic Medicine

## 2019-11-28 MED ORDER — FLUCONAZOLE 150 MG PO TABS: 300.0000 mg | ORAL_TABLET | ORAL | 1 refills | Status: AC

## 2019-12-11 ENCOUNTER — Other Ambulatory Visit: Payer: Self-pay | Admitting: Nurse Practitioner

## 2019-12-11 DIAGNOSIS — Z0189 Encounter for other specified special examinations: Secondary | ICD-10-CM | POA: Diagnosis not present

## 2019-12-11 DIAGNOSIS — R1011 Right upper quadrant pain: Secondary | ICD-10-CM | POA: Diagnosis not present

## 2019-12-11 DIAGNOSIS — R42 Dizziness and giddiness: Secondary | ICD-10-CM | POA: Diagnosis not present

## 2019-12-14 LAB — QUANTIFERON-TB GOLD PLUS
Mitogen-NIL: >10 IU/mL
NIL: 0.03 IU/mL
QuantiFERON-TB Gold Plus: NEGATIVE
TB1-NIL: 0.01 IU/mL
TB2-NIL: 0 IU/mL

## 2019-12-17 ENCOUNTER — Encounter: Payer: Self-pay | Admitting: Nurse Practitioner

## 2019-12-26 ENCOUNTER — Encounter: Payer: Self-pay | Admitting: Nurse Practitioner

## 2020-01-07 ENCOUNTER — Encounter: Payer: Self-pay | Admitting: Nurse Practitioner

## 2020-01-07 DIAGNOSIS — Z23 Encounter for immunization: Secondary | ICD-10-CM | POA: Diagnosis not present

## 2020-01-20 ENCOUNTER — Other Ambulatory Visit: Payer: Self-pay | Admitting: Nurse Practitioner

## 2020-01-20 DIAGNOSIS — R030 Elevated blood-pressure reading, without diagnosis of hypertension: Secondary | ICD-10-CM

## 2020-05-06 ENCOUNTER — Other Ambulatory Visit: Payer: Self-pay | Admitting: Osteopathic Medicine

## 2020-05-06 ENCOUNTER — Encounter: Payer: Self-pay | Admitting: Osteopathic Medicine

## 2020-05-06 DIAGNOSIS — G4726 Circadian rhythm sleep disorder, shift work type: Secondary | ICD-10-CM

## 2020-05-06 NOTE — Telephone Encounter (Signed)
LVM for patient to call back to get this appt scheduled. AM 

## 2020-07-05 ENCOUNTER — Ambulatory Visit: Payer: BC Managed Care – PPO | Admitting: Osteopathic Medicine

## 2020-08-14 ENCOUNTER — Other Ambulatory Visit: Payer: Self-pay | Admitting: Osteopathic Medicine

## 2020-08-14 DIAGNOSIS — R030 Elevated blood-pressure reading, without diagnosis of hypertension: Secondary | ICD-10-CM

## 2020-08-14 DIAGNOSIS — G4726 Circadian rhythm sleep disorder, shift work type: Secondary | ICD-10-CM

## 2020-08-16 NOTE — Telephone Encounter (Signed)
NOT SEEN BY ME SINCE 2020 Absolutely NO refills until seen  Please schedule

## 2020-08-17 NOTE — Telephone Encounter (Signed)
Patient is scheduled to come in on 08/19/20.

## 2020-08-19 ENCOUNTER — Ambulatory Visit (INDEPENDENT_AMBULATORY_CARE_PROVIDER_SITE_OTHER): Payer: BC Managed Care – PPO | Admitting: Osteopathic Medicine

## 2020-08-19 ENCOUNTER — Other Ambulatory Visit: Payer: Self-pay

## 2020-08-19 ENCOUNTER — Encounter: Payer: Self-pay | Admitting: Osteopathic Medicine

## 2020-08-19 VITALS — BP 124/78 | HR 70 | Temp 97.9°F | Wt 177.0 lb

## 2020-08-19 DIAGNOSIS — I10 Essential (primary) hypertension: Secondary | ICD-10-CM | POA: Insufficient documentation

## 2020-08-19 DIAGNOSIS — Z0000 Encounter for general adult medical examination without abnormal findings: Secondary | ICD-10-CM | POA: Diagnosis not present

## 2020-08-19 DIAGNOSIS — G4726 Circadian rhythm sleep disorder, shift work type: Secondary | ICD-10-CM

## 2020-08-19 DIAGNOSIS — Z8042 Family history of malignant neoplasm of prostate: Secondary | ICD-10-CM

## 2020-08-19 DIAGNOSIS — E7800 Pure hypercholesterolemia, unspecified: Secondary | ICD-10-CM | POA: Diagnosis not present

## 2020-08-19 DIAGNOSIS — E7849 Other hyperlipidemia: Secondary | ICD-10-CM

## 2020-08-19 DIAGNOSIS — Z1211 Encounter for screening for malignant neoplasm of colon: Secondary | ICD-10-CM

## 2020-08-19 MED ORDER — MODAFINIL 200 MG PO TABS
100.0000 mg | ORAL_TABLET | Freq: Every day | ORAL | 0 refills | Status: AC | PRN
Start: 2020-08-19 — End: ?

## 2020-08-19 MED ORDER — HYDROCHLOROTHIAZIDE 12.5 MG PO TABS: 12.5000 mg | ORAL_TABLET | Freq: Every day | ORAL | 3 refills | Status: DC

## 2020-08-19 NOTE — Progress Notes (Signed)
Jesus Baker is a 45 y.o. male who presents to  [place] at [facility]  today, 08/19/20, seeking care for the following:  . Annual physical      ASSESSMENT & PLAN with other pertinent findings:  The primary encounter diagnosis was Annual physical exam. Diagnoses of Shift work sleep disorder, Elevated LDL cholesterol level, Family history of prostate cancer, Primary hypertension, and Familial hyperlipidemia were also pertinent to this visit.    Patient Instructions  General Preventive Care  Most recent routine screening labs: ordered today.   Blood pressure goal 130/80 or less.   Tobacco: don't!   Alcohol: responsible moderation is ok for most adults - if you have concerns about your alcohol intake, please talk to me!   Exercise: as tolerated to reduce risk of cardiovascular disease and diabetes. Strength training will also prevent osteoporosis.   Mental health: if need for mental health care (medicines, counseling, other), or concerns about moods, please let me know!   Sexual / Reproductive health: if need for STD testing, or if concerns with libido/pain problems, please let me know! If you need to discuss family planning, please let me know!   Advanced Directive: Living Will and/or Healthcare Power of Attorney recommended for all adults, regardless of age or health.  Vaccines  Flu vaccine: for almost everyone, every fall.   Shingles vaccine: after age 62.   Pneumonia vaccines: after age 51, or sooner if certain medical conditions.  Tetanus booster: every 10 years, due 2027  COVID vaccine: THANKS for getting your vaccine! :) Cancer screenings   Colon cancer screening: for everyone age 23-75. Colonoscopy available for all, many people also qualify for the Cologuard stool test   Prostate cancer screening: PSA blood test age 34-71   Lung cancer screening: not needed for non-smokers  Infection screenings  . HIV: recommended  screening at least once age 61-65 . Gonorrhea/Chlamydia, other STI: screening as needed . Hepatitis C: recommended once for everyone age 71-75 . TB: certain at-risk populations Other . Bone Density Test: recommended at age 2 . Abdominal Aortic Aneurysm: screening with ultrasound recommended once for men age 31-75 who have ever smoked   Orders Placed This Encounter  Procedures  . CBC  . CMP14+EGFR  . PSA, Total with Reflex to PSA, Free  . Apo A1 + B + Ratio  . Lipid panel  . PSA Total (Reflex To Free)    Meds ordered this encounter  Medications  . hydrochlorothiazide (HYDRODIURIL) 12.5 MG tablet    Sig: Take 1 tablet (12.5 mg total) by mouth daily.    Dispense:  90 tablet    Refill:  3    DX Code Needed  .  . modafinil (PROVIGIL) 200 MG tablet    Sig: Take 0.5-1 tablets (100-200 mg total) by mouth daily as needed.    Dispense:  30 tablet    Refill:  0    This request is for a new prescription for a controlled substance as required by Federal/State law.     See below for relevant physical exam findings  See below for recent lab and imaging results reviewed  Medications, allergies, PMH, PSH, SocH, [place] reviewed below    Follow-up instructions: Return in about 1 year (around 08/19/2021) for [place] (call week prior to visit for lab orders).  Exam:  BP 124/78 (BP Location: Left Arm, Patient Position: Sitting, Cuff Size: Normal)   Pulse 70   Temp 97.9 F (36.6 C) (Oral)   Wt 177 lb 0.6 oz (80.3 kg)   BMI 28.58 kg/m   Constitutional: VS see above. General Appearance: alert, well-developed, well-nourished, NAD  Neck: No masses, trachea midline.   Respiratory: Normal respiratory effort. no wheeze, no rhonchi, no rales  Cardiovascular: S1/S2 normal, no murmur, no rub/gallop auscultated. RRR.   Musculoskeletal: Gait normal. Symmetric and independent movement of all extremities  Abdominal:  non-tender, non-distended, no appreciable organomegaly, neg Murphy's, BS WNLx4  Neurological: Normal balance/coordination. No tremor.  Skin: warm, dry, intact.   Psychiatric: Normal judgment/insight. Normal mood and affect. Oriented x3.   Current Meds  Medication Sig  . Cholecalciferol (VITAMIN D-3 PO) Take 1 capsule by mouth daily.  Marland Kitchen MAGNESIUM PO Take by mouth.  . Multiple Vitamin (MULTIVITAMIN) capsule Take 1 capsule by mouth daily.  . Omega-3 Fatty Acids (FISH OIL) 1000 MG CAPS Take 1 capsule by mouth daily.  Marland Kitchen selenium sulfide (SELSUN) 2.5 % shampoo APPLY 1 APPLICATION TOPICALLY FOR 7 DAYS  . [DISCONTINUED] hydrochlorothiazide (HYDRODIURIL) 12.5 MG tablet TAKE 1 TABLET BY MOUTH EVERY DAY  . [DISCONTINUED] modafinil (PROVIGIL) 200 MG tablet Take 0.5-1 tablets (100-200 mg total) by mouth daily as needed.    Allergies  Allergen Reactions  . Poison Ivy Extract [Poison Ivy Extract]   . Gering Extract]   . Sumac     Patient Active Problem List   Diagnosis Date Noted  . Family history of prostate cancer 08/19/2020  . Primary hypertension 08/19/2020  . H. pylori infection 05/31/2018  . Elevated LDL cholesterol level 05/07/2018  . 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 Well adult 10/27/2015  . 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 Cancer Maternal Grandfather        melanoma  . Stroke Maternal Grandfather   . Hyperlipidemia Father   . Hypertension Mother     Social History   Tobacco Use  Smoking Status Never Smoker  Smokeless Tobacco Never Used    Past Surgical History:  Procedure Laterality Date  . KNEE ARTHROSCOPY    . KNEE ARTHROSCOPY W/ ACL RECONSTRUCTION      Immunization History  Administered Date(s) Administered  . Hepatitis A 07/07/1999  . Hepatitis B 07/07/1999  . Influenza,inj,Quad PF,6+ Mos 12/24/2015, 12/28/2017, 01/07/2020  . Influenza-Unspecified 07/07/2015, 01/23/2017,  12/21/2018, 01/17/2019  . MMR 10/06/1993  . PFIZER(Purple Top)SARS-COV-2 Vaccination 06/20/2019, 07/11/2019, 04/03/2020  . PPD Test 10/19/2015, 10/18/2016, 10/24/2017  . Tdap 07/07/2015  . Typhoid Live 04/03/2018  . Varicella 12/06/1988    No results found for this or any previous visit (from the past 2160 hour(s)).  No results found.     All questions at time of visit were answered - patient instructed to contact office with any additional concerns or updates. ER/RTC precautions were reviewed with the patient as applicable.   Please note: manual typing as well as voice recognition software may have been used to produce this document - typos may escape review. Please contact Dr. Sheppard Coil for any needed clarifications.

## 2020-08-19 NOTE — Patient Instructions (Signed)
General Preventive Care  Most recent routine screening labs: ordered today.   Blood pressure goal 130/80 or less.   Tobacco: don't!   Alcohol: responsible moderation is ok for most adults - if you have concerns about your alcohol intake, please talk to me!   Exercise: as tolerated to reduce risk of cardiovascular disease and diabetes. Strength training will also prevent osteoporosis.   Mental health: if need for mental health care (medicines, counseling, other), or concerns about moods, please let me know!   Sexual / Reproductive health: if need for STD testing, or if concerns with libido/pain problems, please let me know! If you need to discuss family planning, please let me know!   Advanced Directive: Living Will and/or Healthcare Power of Attorney recommended for all adults, regardless of age or health.  Vaccines  Flu vaccine: for almost everyone, every fall.   Shingles vaccine: after age 48.   Pneumonia vaccines: after age 37, or sooner if certain medical conditions.  Tetanus booster: every 10 years, due 2027  COVID vaccine: THANKS for getting your vaccine! :) Cancer screenings   Colon cancer screening: for everyone age 46-75. Colonoscopy available for all, many people also qualify for the Cologuard stool test   Prostate cancer screening: PSA blood test age 91-71   Lung cancer screening: not needed for non-smokers  Infection screenings  . HIV: recommended screening at least once age 62-65 . Gonorrhea/Chlamydia, other STI: screening as needed . Hepatitis C: recommended once for everyone age 56-75 . TB: certain at-risk populations Other . Bone Density Test: recommended at age 16 . Abdominal Aortic Aneurysm: screening with ultrasound recommended once for men age 68-75 who have ever smoked

## 2020-08-24 ENCOUNTER — Telehealth: Payer: Self-pay

## 2020-08-24 NOTE — Telephone Encounter (Signed)
Prior authorization for modafinil submitted to insurance. Pending a determination.

## 2020-08-26 ENCOUNTER — Encounter: Payer: Self-pay | Admitting: Osteopathic Medicine

## 2020-09-24 NOTE — Telephone Encounter (Signed)
Prior authorization for Modafinil approved by insurance. Effective from 08/24/2020 through 08/23/2021.

## 2020-11-05 ENCOUNTER — Encounter: Payer: Self-pay | Admitting: Osteopathic Medicine

## 2020-11-05 NOTE — Telephone Encounter (Signed)
Yes Needs 3 shots Can schedule nurse visits

## 2021-01-14 ENCOUNTER — Encounter: Payer: Self-pay | Admitting: Osteopathic Medicine

## 2021-01-14 DIAGNOSIS — Z1211 Encounter for screening for malignant neoplasm of colon: Secondary | ICD-10-CM

## 2021-02-13 ENCOUNTER — Encounter: Payer: Self-pay | Admitting: Osteopathic Medicine

## 2021-02-14 MED ORDER — SELENIUM SULFIDE 2.5 % EX LOTN: TOPICAL_LOTION | CUTANEOUS | 12 refills | Status: DC

## 2021-02-14 NOTE — Telephone Encounter (Signed)
Pt has not had a prescription for this medication since 2020.  Please review and refill if appropriate.  Tiajuana Amass, CMA

## 2021-03-02 ENCOUNTER — Encounter: Payer: BC Managed Care – PPO | Admitting: Gastroenterology

## 2021-04-08 DIAGNOSIS — Z1211 Encounter for screening for malignant neoplasm of colon: Secondary | ICD-10-CM | POA: Diagnosis not present

## 2021-07-29 ENCOUNTER — Other Ambulatory Visit: Payer: Self-pay | Admitting: Osteopathic Medicine

## 2021-08-26 ENCOUNTER — Other Ambulatory Visit: Payer: Self-pay | Admitting: Osteopathic Medicine

## 2021-08-27 ENCOUNTER — Other Ambulatory Visit: Payer: Self-pay | Admitting: Osteopathic Medicine

## 2021-08-27 DIAGNOSIS — I10 Essential (primary) hypertension: Secondary | ICD-10-CM

## 2021-09-13 DIAGNOSIS — D1801 Hemangioma of skin and subcutaneous tissue: Secondary | ICD-10-CM | POA: Diagnosis not present

## 2021-09-13 DIAGNOSIS — B360 Pityriasis versicolor: Secondary | ICD-10-CM | POA: Diagnosis not present

## 2021-09-29 ENCOUNTER — Other Ambulatory Visit: Payer: Self-pay | Admitting: Medical-Surgical

## 2021-09-29 DIAGNOSIS — I10 Essential (primary) hypertension: Secondary | ICD-10-CM

## 2021-09-30 ENCOUNTER — Ambulatory Visit (INDEPENDENT_AMBULATORY_CARE_PROVIDER_SITE_OTHER): Payer: BC Managed Care – PPO

## 2021-09-30 ENCOUNTER — Ambulatory Visit: Payer: BC Managed Care – PPO | Admitting: Sports Medicine

## 2021-09-30 DIAGNOSIS — G8929 Other chronic pain: Secondary | ICD-10-CM | POA: Diagnosis not present

## 2021-09-30 DIAGNOSIS — M25511 Pain in right shoulder: Secondary | ICD-10-CM | POA: Diagnosis not present

## 2021-09-30 MED ORDER — MELOXICAM 15 MG PO TABS: ORAL_TABLET | ORAL | 3 refills | Status: DC

## 2021-09-30 NOTE — Assessment & Plan Note (Signed)
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 On exam he has significant pain and weakness with liftoff test, he also has a positive speeds test with pain and weakness. Other impingement signs are mildly positive. I do think he has a subscapularis tear, potentially some bicipital and labral involvement. Considering weakness and his high demand shoulder I do think we need to proceed aggressively. X-rays today, Mobic, noncontrast MRI. He does have a shoulder surgeon at [facility] that he would likely have me refer him to should he need operative intervention.

## 2021-09-30 NOTE — Progress Notes (Signed)
    Procedures performed today:    None.  Independent interpretation of notes and tests performed by another provider:   None.  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 On exam he has significant pain and weakness with liftoff test, he also has a positive speeds test with pain and weakness. Other impingement signs are mildly positive. I do think he has a subscapularis tear, potentially some bicipital and labral involvement. Considering weakness and his high demand shoulder I do think we need to proceed aggressively. X-rays today, Mobic, noncontrast MRI. He does have a shoulder surgeon at [facility] that he would likely have me refer him to should he need operative intervention.    ___________________________________________ Ihor Austin. Benjamin Stain, M.D., ABFM., CAQSM. Primary Care and Sports Medicine [facility]  Adjunct Instructor of Family Medicine  [employer]

## 2021-10-09 ENCOUNTER — Ambulatory Visit: Payer: BC Managed Care – PPO

## 2021-10-09 ENCOUNTER — Encounter: Payer: Self-pay | Admitting: Sports Medicine

## 2021-10-09 DIAGNOSIS — G8929 Other chronic pain: Secondary | ICD-10-CM

## 2021-10-10 MED ORDER — TRIAZOLAM 0.25 MG PO TABS: ORAL_TABLET | ORAL | 0 refills | Status: DC

## 2021-10-11 ENCOUNTER — Other Ambulatory Visit: Payer: BC Managed Care – PPO

## 2021-10-11 ENCOUNTER — Other Ambulatory Visit: Payer: Self-pay | Admitting: Sports Medicine

## 2021-10-11 DIAGNOSIS — G8929 Other chronic pain: Secondary | ICD-10-CM

## 2021-10-11 MED ORDER — TRIAZOLAM 0.25 MG PO TABS: ORAL_TABLET | ORAL | 0 refills | Status: DC

## 2021-10-17 ENCOUNTER — Other Ambulatory Visit: Payer: Self-pay | Admitting: Medical-Surgical

## 2021-10-17 ENCOUNTER — Ambulatory Visit (INDEPENDENT_AMBULATORY_CARE_PROVIDER_SITE_OTHER): Payer: BC Managed Care – PPO

## 2021-10-17 DIAGNOSIS — G8929 Other chronic pain: Secondary | ICD-10-CM | POA: Diagnosis not present

## 2021-10-17 DIAGNOSIS — M19011 Primary osteoarthritis, right shoulder: Secondary | ICD-10-CM | POA: Diagnosis not present

## 2021-10-17 DIAGNOSIS — M75111 Incomplete rotator cuff tear or rupture of right shoulder, not specified as traumatic: Secondary | ICD-10-CM | POA: Diagnosis not present

## 2021-10-17 DIAGNOSIS — M25511 Pain in right shoulder: Secondary | ICD-10-CM | POA: Diagnosis not present

## 2021-10-17 MED ORDER — TRIAZOLAM 0.25 MG PO TABS: ORAL_TABLET | ORAL | 0 refills | Status: DC

## 2021-10-20 ENCOUNTER — Encounter: Payer: Self-pay | Admitting: Sports Medicine

## 2021-10-20 DIAGNOSIS — G8929 Other chronic pain: Secondary | ICD-10-CM

## 2021-11-04 DIAGNOSIS — M75121 Complete rotator cuff tear or rupture of right shoulder, not specified as traumatic: Secondary | ICD-10-CM | POA: Diagnosis not present

## 2021-11-09 ENCOUNTER — Encounter (HOSPITAL_BASED_OUTPATIENT_CLINIC_OR_DEPARTMENT_OTHER): Payer: Self-pay | Admitting: Orthopaedic Surgery

## 2021-11-09 ENCOUNTER — Other Ambulatory Visit: Payer: Self-pay

## 2021-11-15 ENCOUNTER — Encounter (HOSPITAL_BASED_OUTPATIENT_CLINIC_OR_DEPARTMENT_OTHER)
Admission: RE | Admit: 2021-11-15 | Discharge: 2021-11-15 | Disposition: A | Discharge status: Home / Self Care | Payer: BC Managed Care – PPO | Source: Ambulatory Visit | Attending: Orthopaedic Surgery | Admitting: Orthopaedic Surgery

## 2021-11-15 DIAGNOSIS — M25811 Other specified joint disorders, right shoulder: Secondary | ICD-10-CM | POA: Diagnosis not present

## 2021-11-15 DIAGNOSIS — S43431A Superior glenoid labrum lesion of right shoulder, initial encounter: Secondary | ICD-10-CM | POA: Diagnosis not present

## 2021-11-15 DIAGNOSIS — M7521 Bicipital tendinitis, right shoulder: Secondary | ICD-10-CM | POA: Diagnosis not present

## 2021-11-15 DIAGNOSIS — S46011A Strain of muscle(s) and tendon(s) of the rotator cuff of right shoulder, initial encounter: Secondary | ICD-10-CM | POA: Diagnosis not present

## 2021-11-15 DIAGNOSIS — M7541 Impingement syndrome of right shoulder: Secondary | ICD-10-CM | POA: Diagnosis not present

## 2021-11-15 DIAGNOSIS — I10 Essential (primary) hypertension: Secondary | ICD-10-CM | POA: Diagnosis not present

## 2021-11-15 DIAGNOSIS — X58XXXA Exposure to other specified factors, initial encounter: Secondary | ICD-10-CM | POA: Diagnosis not present

## 2021-11-15 LAB — BASIC METABOLIC PANEL
Anion gap: 9 (ref 5–15)
BUN: 18 mg/dL (ref 6–20)
CO2: 28 mmol/L (ref 22–32)
Calcium: 9.5 mg/dL (ref 8.9–10.3)
Chloride: 101 mmol/L (ref 98–111)
Creatinine, Ser: 1.31 mg/dL — ABNORMAL HIGH (ref 0.61–1.24)
GFR, Estimated: >60 mL/min (ref >60)
Glucose, Bld: 64 mg/dL — ABNORMAL LOW (ref 70–99)
Potassium: 4.5 mmol/L (ref 3.5–5.1)
Sodium: 138 mmol/L (ref 135–145)

## 2021-11-15 NOTE — Progress Notes (Signed)
Surgical soap given with instructions, pt verbalized understanding. Enhanced Recovery after Surgery  ?Enhanced Recovery after Surgery is a protocol used to improve the stress on your body and your recovery after surgery. ? ?Patient Instructions ? ?The night before surgery:  ?No food after midnight. ONLY clear liquids after midnight ? ?The day of surgery (if you do NOT have diabetes):  ?Drink ONE (1) Pre-Surgery Clear Ensure as directed.   ?This drink was given to you during your hospital  ?pre-op appointment visit. ?The pre-op nurse will instruct you on the time to drink the  ?Pre-Surgery Ensure depending on your surgery time. ?Finish the drink at the designated time by the pre-op nurse.  ?Nothing else to drink after completing the  ?Pre-Surgery Clear Ensure. ? ?The day of surgery (if you have diabetes): ?Drink ONE (1) Gatorade 2 (G2) as directed. ?This drink was given to you during your hospital  ?pre-op appointment visit.  ?The pre-op nurse will instruct you on the time to drink the  ? Gatorade 2 (G2) depending on your surgery time. ?Color of the Gatorade may vary. Red is not allowed. ?Nothing else to drink after completing the  ?Gatorade 2 (G2). ? ?       If office.you have questions, please contact your surgeon?s office  ?Benzoyl peroxide gel given with written instructions, pt verbalized understanding.  ?

## 2021-11-16 NOTE — H&P (Signed)
PREOPERATIVE H&P  Chief Complaint: RIGHT SHOULDER ROTATOR CUFF TEAR, BICEPS TENDONITIS  HPI: Jesus Baker is a 46 y.o. male who is scheduled for, Procedure(s): SHOULDER ARTHROSCOPY WITH ROTATOR CUFF REPAIR, SUBACROMIAL DECOMPRESSION AND BICEPS TENODESIS.   Patient has a past medical history significant for HTN.   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  He has tried and failed nonoperative measures.  He is right hand dominant at baseline.  He is very active.  He does rock climbing, weight lifting, and hasn't been able to continue with these things.  Review of systems is negative.  Symptoms are rated as moderate to severe, and have been worsening.  This is significantly impairing activities of daily living.    Please see clinic note for further details on this patient's care.    He has elected for surgical management.   Past Medical History:  Diagnosis Date   Complication of anesthesia    Patient reports "bad shakes" after anesthesia   HTN (hypertension)    Past Surgical History:  Procedure Laterality Date   KNEE ARTHROSCOPY     KNEE ARTHROSCOPY W/ ACL RECONSTRUCTION     Social History   Socioeconomic History   Marital status: Married    Spouse name: Lorene Dy   Number of children: Not on file   Years of education: 16   Highest education level: Not on file  Occupational History   Occupation: self employed  Tobacco Use   Smoking status: Never   Smokeless tobacco: Never  Substance and Sexual Activity   Alcohol use: Yes    Alcohol/week: 3.0 standard drinks of alcohol    Types: 3 Standard drinks or equivalent per week   Drug use: No   Sexual activity: Yes    Partners: Female  Other Topics Concern   Not on file  Social History Narrative   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 He is married with 3 children 17,5,2. Exercises 60 min 4 days a week.[employer]    Social Determinants of Health   Financial Resource Strain: Not on file  Food Insecurity: Not on file  Transportation Needs: Not on file  Physical Activity: Not on file  Stress: Not on file  Social Connections: Not on file   Family History  Problem Relation Age of Onset   Cancer Maternal Grandfather        melanoma   Stroke Maternal Grandfather    Hyperlipidemia Father    Hypertension Mother    Allergies  Allergen Reactions   Benadryl [Diphenhydramine]     Patient states not an allergy, but medication usually has "opposite effect" for him   Poison Ivy Extract [Poison Fisher Scientific Extract]    Poison Oak Extract [Poison Oak Extract]    Sumac    Prior to Admission medications   Medication Sig Start Date End Date Taking? Authorizing Provider  Cholecalciferol (VITAMIN D-3 PO) Take 1 capsule by mouth daily.   Yes [provider]  hydrochlorothiazide (HYDRODIURIL) 12.5 MG tablet Take 1 tablet (12.5 mg total) by mouth daily. LAST REFILLS. NEEDS TO TRANSFER CARE TO NEW PCP 08/29/21  Yes Christen Butter, NP  MAGNESIUM PO Take by mouth.   Yes [provider]  meloxicam (MOBIC) 15 MG tablet One tab PO every 24 hours with a meal for 2 weeks, then once every 24 hours prn pain. 09/30/21  Yes Monica Becton, MD  modafinil (PROVIGIL) 200 MG  tablet Take 0.5-1 tablets (100-200 mg total) by mouth daily as needed. 08/19/20  Yes Sunnie Nielsen, DO  Multiple Vitamin (MULTIVITAMIN) capsule Take 1 capsule by mouth daily.   Yes [provider]  Omega-3 Fatty Acids (FISH OIL) 1000 MG CAPS Take 1 capsule by mouth daily.   Yes [provider]  selenium sulfide (SELSUN) 2.5 % shampoo APPLY 1 APPLICATION TOPICALLY FOR 7 DAYS 02/14/21   Everrett Coombe, DO  triazolam (HALCION) 0.25 MG tablet 1-2 tabs PO 2 hours before procedure or imaging.  Do not drive with this medication. 10/17/21   Christen Butter, NP    ROS: All other systems have been reviewed and were otherwise negative with  the exception of those mentioned in the HPI and as above.  Physical Exam: General: Alert, no acute distress Cardiovascular: No pedal edema Respiratory: No cyanosis, no use of accessory musculature GI: No organomegaly, abdomen is soft and non-tender Skin: No lesions in the area of chief complaint Neurologic: Sensation intact distally Psychiatric: Patient is competent for consent with normal mood and affect Lymphatic: No axillary or cervical lymphadenopathy  MUSCULOSKELETAL:   Range of motion of the shoulder is full.  He has negative AC tenderness to palpation; positive O'Brien's; weakness with subscapularis and supraspinatus testing.  Warm, well-perfused extremity   Imaging: MRI demonstrates an upper border subscapularis tear about 50% of the tendon.  There is subluxation of the biceps tendon.  There is a leading edge supraspinatus tear as well.  He has a type 2 acromion  Assessment: RIGHT SHOULDER ROTATOR CUFF TEAR, BICEPS TENDONITIS  Plan: Plan for Procedure(s): SHOULDER ARTHROSCOPY WITH ROTATOR CUFF REPAIR, SUBACROMIAL DECOMPRESSION AND BICEPS TENODESIS   The risks benefits and alternatives were discussed with the patient including but not limited to the risks of nonoperative treatment, versus surgical intervention including infection, bleeding, nerve injury,  blood clots, cardiopulmonary complications, morbidity, mortality, among others, and they were willing to proceed.   The patient acknowledged the explanation, agreed to proceed with the plan and consent was signed.   Operative Plan: Right shoulder scope with RCR, BT, SAD Discharge Medications: Standard  DVT Prophylaxis: None Physical Therapy: Outpatient PT - 7/17 @8AM  Special Discharge needs: Sling. IceMan   , PA-C  11/16/2021 9:14 AM

## 2021-11-16 NOTE — Discharge Instructions (Signed)
Ramond Marrow MD, MPH Alfonse Alpers, PA-C [facility] [address] [phone] (tel)   [phone] (fax)   POST-OPERATIVE INSTRUCTIONS - SHOULDER ARTHROSCOPY  WOUND CARE You may remove the Operative Dressing on Post-Op Day #3 (72hrs after surgery).   Alternatively if you would like you can leave dressing on until follow-up if within 7-8 days but keep it dry. Leave steri-strips in place until they fall off on their own, usually 2 weeks postop. There may be a small amount of fluid/bleeding leaking at the surgical site.  This is normal; the shoulder is filled with fluid during the procedure and can leak for 24-48hrs after surgery.  You may change/reinforce the bandage as needed.  Use the Cryocuff or Ice as often as possible for the first 7 days, then as needed for pain relief. Always keep a towel, ACE wrap or other barrier between the cooling unit and your skin.  You may shower on Post-Op Day #3. Gently pat the area dry.  Do not soak the shoulder in water or submerge it. Keep dry incisions as dry as possible. Do not go swimming in the pool or ocean until 4 weeks after surgery or when otherwise instructed.    EXERCISES Wear the sling at all times  You may remove the sling for showering, but keep the arm across the chest or in a secondary sling.     It is normal for your fingers/hand to become more swollen after surgery and discolored from bruising.   This will resolve over the first few weeks usually after surgery. Please continue to ambulate and do not stay sitting or lying for too long.  Perform foot and wrist pumps to assist in circulation.  PHYSICAL THERAPY - You will begin physical therapy soon after surgery (unless otherwise specified) - Please call to set up an appointment, if you do not already have one  - Let our office if there are any issues with scheduling your therapy  - You have a physical therapy appointment scheduled at [facility] (across  the hall from our office) on Monday, July 17th at 8 am   REGIONAL ANESTHESIA (NERVE BLOCKS) The anesthesia team may have performed a nerve block for you this is a great tool used to minimize pain.   The block may start wearing off overnight (between 8-24 hours postop) When the block wears off, your pain may go from nearly zero to the pain you would have had postop without the block. This is an abrupt transition but nothing dangerous is happening.   This can be a challenging period but utilize your as needed pain medications to try and manage this period. We suggest you use the pain medication the first night prior to going to bed, to ease this transition.  You may take an extra dose of narcotic when this happens if needed  POST-OP MEDICATIONS- Multimodal approach to pain control In general your pain will be controlled with a combination of substances.  Prescriptions unless otherwise discussed are electronically sent to your pharmacy.  This is a carefully made plan we use to minimize narcotic use.     Celebrex - Anti-inflammatory medication taken on a scheduled basis Acetaminophen - Non-narcotic pain medicine taken on a scheduled basis  Oxycodone - This is a strong narcotic, to be used only on an "as needed" basis for SEVERE pain. Zofran - take as needed for nausea   FOLLOW-UP If you develop a Fever (?101.5), Redness or Drainage from the  surgical incision site, please call our office to arrange for an evaluation. Please call the office to schedule a follow-up appointment for your first post-operative appointment, 7-10 days post-operatively.    HELPFUL INFORMATION   You may be more comfortable sleeping in a semi-seated position the first few nights following surgery.  Keep a pillow propped under the elbow and forearm for comfort.  If you have a recliner type of chair it might be beneficial.  If not that is fine too, but it would be helpful to sleep propped up with pillows behind your  operated shoulder as well under your elbow and forearm.  This will reduce pulling on the suture lines.  When dressing, put your operative arm in the sleeve first.  When getting undressed, take your operative arm out last.  Loose fitting, button-down shirts are recommended.  Often in the first days after surgery you may be more comfortable keeping your operative arm under your shirt and not through the sleeve.  You may return to work/school in the next couple of days when you feel up to it.  Desk work and typing in the sling is fine.  We suggest you use the pain medication the first night prior to going to bed, in order to ease any pain when the anesthesia wears off. You should avoid taking pain medications on an empty stomach as it will make you nauseous.  You should wean off your narcotic medicines as soon as you are able.  Most patients will be off or using minimal narcotics before their first postop appointment.   Do not drink alcoholic beverages or take illicit drugs when taking pain medications.  It is against the law to drive while taking narcotics.  In some states it is against the law to drive while your arm is in a sling.   Pain medication may make you constipated.  Below are a few solutions to try in this order: Decrease the amount of pain medication if you aren't having pain. Drink lots of decaffeinated fluids. Drink prune juice and/or eat dried prunes  If the first 3 don't work start with additional solutions Take Colace - an over-the-counter stool softener Take Senokot - an over-the-counter laxative Take Miralax - a stronger over-the-counter laxative  For more information including helpful videos and documents visit our website:   [email or web address]    Post Anesthesia Home Care Instructions  Activity: Get plenty of rest for the remainder of the day. A responsible individual must stay with you for 24 hours following the procedure.  For  the next 24 hours, DO NOT: -Drive a car -Advertising copywriter -Drink alcoholic beverages -Take any medication unless instructed by your physician -Make any legal decisions or sign important papers.  Meals: Start with liquid foods such as gelatin or soup. Progress to regular foods as tolerated. Avoid greasy, spicy, heavy foods. If nausea and/or vomiting occur, drink only clear liquids until the nausea and/or vomiting subsides. Call your physician if vomiting continues.  Special Instructions/Symptoms: Your throat may feel dry or sore from the anesthesia or the breathing tube placed in your throat during surgery. If this causes discomfort, gargle with warm salt water. The discomfort should disappear within 24 hours.  If you had a scopolamine patch placed behind your ear for the management of post- operative nausea and/or vomiting:  1. The medication in the patch is effective for 72 hours, after which it should be removed.  Wrap patch in a tissue and discard in the trash. Wash hands  thoroughly with soap and water. 2. You may remove the patch earlier than 72 hours if you experience unpleasant side effects which may include dry mouth, dizziness or visual disturbances. 3. Avoid touching the patch. Wash your hands with soap and water after contact with the patch.     Regional Anesthesia Blocks  1. Numbness or the inability to move the "blocked" extremity may last from 3-48 hours after placement. The length of time depends on the medication injected and your individual response to the medication. If the numbness is not going away after 48 hours, call your surgeon.  2. The extremity that is blocked will need to be protected until the numbness is gone and the  Strength has returned. Because you cannot feel it, you will need to take extra care to avoid injury. Because it may be weak, you may have difficulty moving it or using it. You may not know what position it is in without looking at it while the block  is in effect.  3. For blocks in the legs and feet, returning to weight bearing and walking needs to be done carefully. You will need to wait until the numbness is entirely gone and the strength has returned. You should be able to move your leg and foot normally before you try and bear weight or walk. You will need someone to be with you when you first try to ensure you do not fall and possibly risk injury.  4. Bruising and tenderness at the needle site are common side effects and will resolve in a few days.  5. Persistent numbness or new problems with movement should be communicated to the surgeon or the [facility] [phone] [facility] [phone]).   Information for Discharge Teaching: EXPAREL (bupivacaine liposome injectable suspension)   Your surgeon or anesthesiologist gave you EXPAREL(bupivacaine) to help control your pain after surgery.  EXPAREL is a local anesthetic that provides pain relief by numbing the tissue around the surgical site. EXPAREL is designed to release pain medication over time and can control pain for up to 72 hours. Depending on how you respond to EXPAREL, you may require less pain medication during your recovery.  Possible side effects: Temporary loss of sensation or ability to move in the area where bupivacaine was injected. Nausea, vomiting, constipation Rarely, numbness and tingling in your mouth or lips, lightheadedness, or anxiety may occur. Call your doctor right away if you think you may be experiencing any of these sensations, or if you have other questions regarding possible side effects.  Follow all other discharge instructions given to you by your surgeon or nurse. Eat a healthy diet and drink plenty of water or other fluids.  If you return to the hospital for any reason within 96 hours following the administration of EXPAREL, it is important for health care providers to know that you have received this anesthetic. A  teal colored band has been placed on your arm with the date, time and amount of EXPAREL you have received in order to alert and inform your health care providers. Please leave this armband in place for the full 96 hours following administration, and then you may remove the band.   No tylenol until after 3:15pm if needed today.

## 2021-11-17 ENCOUNTER — Ambulatory Visit (HOSPITAL_BASED_OUTPATIENT_CLINIC_OR_DEPARTMENT_OTHER): Payer: BC Managed Care – PPO | Admitting: Anesthesiology

## 2021-11-17 ENCOUNTER — Encounter (HOSPITAL_BASED_OUTPATIENT_CLINIC_OR_DEPARTMENT_OTHER): Payer: Self-pay | Admitting: Orthopaedic Surgery

## 2021-11-17 ENCOUNTER — Ambulatory Visit (HOSPITAL_BASED_OUTPATIENT_CLINIC_OR_DEPARTMENT_OTHER)
Admission: RE | Admit: 2021-11-17 | Discharge: 2021-11-17 | Disposition: A | Discharge status: Home / Self Care | Payer: BC Managed Care – PPO | Attending: Orthopaedic Surgery | Admitting: Orthopaedic Surgery

## 2021-11-17 ENCOUNTER — Other Ambulatory Visit: Payer: Self-pay

## 2021-11-17 ENCOUNTER — Encounter (HOSPITAL_BASED_OUTPATIENT_CLINIC_OR_DEPARTMENT_OTHER)
Admission: RE | Disposition: A | Discharge status: Home / Self Care | Payer: Self-pay | Source: Home / Self Care | Attending: Orthopaedic Surgery

## 2021-11-17 DIAGNOSIS — M25811 Other specified joint disorders, right shoulder: Secondary | ICD-10-CM | POA: Insufficient documentation

## 2021-11-17 DIAGNOSIS — G8918 Other acute postprocedural pain: Secondary | ICD-10-CM | POA: Diagnosis not present

## 2021-11-17 DIAGNOSIS — M75101 Unspecified rotator cuff tear or rupture of right shoulder, not specified as traumatic: Secondary | ICD-10-CM | POA: Diagnosis not present

## 2021-11-17 DIAGNOSIS — I10 Essential (primary) hypertension: Secondary | ICD-10-CM | POA: Insufficient documentation

## 2021-11-17 DIAGNOSIS — M7541 Impingement syndrome of right shoulder: Secondary | ICD-10-CM | POA: Insufficient documentation

## 2021-11-17 DIAGNOSIS — S46011A Strain of muscle(s) and tendon(s) of the rotator cuff of right shoulder, initial encounter: Secondary | ICD-10-CM | POA: Diagnosis not present

## 2021-11-17 DIAGNOSIS — M7521 Bicipital tendinitis, right shoulder: Secondary | ICD-10-CM | POA: Insufficient documentation

## 2021-11-17 DIAGNOSIS — X58XXXA Exposure to other specified factors, initial encounter: Secondary | ICD-10-CM | POA: Insufficient documentation

## 2021-11-17 DIAGNOSIS — S43431A Superior glenoid labrum lesion of right shoulder, initial encounter: Secondary | ICD-10-CM | POA: Insufficient documentation

## 2021-11-17 DIAGNOSIS — M24111 Other articular cartilage disorders, right shoulder: Secondary | ICD-10-CM | POA: Diagnosis not present

## 2021-11-17 HISTORY — DX: Essential (primary) hypertension: I10

## 2021-11-17 HISTORY — DX: Other complications of anesthesia, initial encounter: T88.59XA

## 2021-11-17 HISTORY — PX: SHOULDER ARTHROSCOPY WITH ROTATOR CUFF REPAIR AND SUBACROMIAL DECOMPRESSION: SHX5686

## 2021-11-17 SURGERY — SHOULDER ARTHROSCOPY WITH ROTATOR CUFF REPAIR AND SUBACROMIAL DECOMPRESSION
Anesthesia: Regional | Site: Shoulder | Laterality: Right

## 2021-11-17 MED ORDER — HYDROMORPHONE HCL 1 MG/ML IJ SOLN: 0.2500 mg | INTRAMUSCULAR | Status: DC | PRN

## 2021-11-17 MED ORDER — OXYCODONE HCL 5 MG PO TABS: 5.0000 mg | ORAL_TABLET | Freq: Once | ORAL | Status: DC | PRN

## 2021-11-17 MED ORDER — DEXAMETHASONE SODIUM PHOSPHATE 4 MG/ML IJ SOLN
INTRAMUSCULAR | Status: DC | PRN
  Administered 2021-11-17: 5 mg via INTRAVENOUS

## 2021-11-17 MED ORDER — OXYCODONE HCL 5 MG/5ML PO SOLN: 5.0000 mg | Freq: Once | ORAL | Status: DC | PRN

## 2021-11-17 MED ORDER — CEFAZOLIN SODIUM-DEXTROSE 2-4 GM/100ML-% IV SOLN
INTRAVENOUS | Status: AC
  Filled 2021-11-17: qty 100

## 2021-11-17 MED ORDER — FENTANYL CITRATE (PF) 100 MCG/2ML IJ SOLN
100.0000 ug | Freq: Once | INTRAMUSCULAR | Status: AC
  Administered 2021-11-17: 100 ug via INTRAVENOUS

## 2021-11-17 MED ORDER — FENTANYL CITRATE (PF) 100 MCG/2ML IJ SOLN
INTRAMUSCULAR | Status: AC
  Filled 2021-11-17: qty 2

## 2021-11-17 MED ORDER — EPHEDRINE 5 MG/ML INJ
INTRAVENOUS | Status: AC
  Filled 2021-11-17: qty 5

## 2021-11-17 MED ORDER — ACETAMINOPHEN 500 MG PO TABS: 1000.0000 mg | ORAL_TABLET | Freq: Three times a day (TID) | ORAL | 0 refills | Status: AC

## 2021-11-17 MED ORDER — PHENYLEPHRINE HCL (PRESSORS) 10 MG/ML IV SOLN
INTRAVENOUS | Status: AC
  Filled 2021-11-17: qty 1

## 2021-11-17 MED ORDER — PROPOFOL 10 MG/ML IV BOLUS
INTRAVENOUS | Status: DC | PRN
  Administered 2021-11-17: 200 mg via INTRAVENOUS

## 2021-11-17 MED ORDER — TRANEXAMIC ACID-NACL 1000-0.7 MG/100ML-% IV SOLN
1000.0000 mg | INTRAVENOUS | Status: AC
  Administered 2021-11-17: 1000 mg via INTRAVENOUS

## 2021-11-17 MED ORDER — OXYCODONE HCL 5 MG PO TABS: ORAL_TABLET | ORAL | 0 refills | Status: AC

## 2021-11-17 MED ORDER — GABAPENTIN 300 MG PO CAPS
300.0000 mg | ORAL_CAPSULE | Freq: Once | ORAL | Status: AC
  Administered 2021-11-17: 300 mg via ORAL

## 2021-11-17 MED ORDER — MIDAZOLAM HCL 2 MG/2ML IJ SOLN
2.0000 mg | Freq: Once | INTRAMUSCULAR | Status: AC
  Administered 2021-11-17: 2 mg via INTRAVENOUS

## 2021-11-17 MED ORDER — TRANEXAMIC ACID-NACL 1000-0.7 MG/100ML-% IV SOLN
INTRAVENOUS | Status: AC
  Filled 2021-11-17: qty 100

## 2021-11-17 MED ORDER — AMISULPRIDE (ANTIEMETIC) 5 MG/2ML IV SOLN
INTRAVENOUS | Status: AC
  Filled 2021-11-17: qty 4

## 2021-11-17 MED ORDER — FENTANYL CITRATE (PF) 100 MCG/2ML IJ SOLN
INTRAMUSCULAR | Status: DC | PRN
  Administered 2021-11-17: 50 ug via INTRAVENOUS

## 2021-11-17 MED ORDER — LACTATED RINGERS IV SOLN: INTRAVENOUS | Status: DC

## 2021-11-17 MED ORDER — ROCURONIUM BROMIDE 100 MG/10ML IV SOLN
INTRAVENOUS | Status: DC | PRN
  Administered 2021-11-17: 70 mg via INTRAVENOUS

## 2021-11-17 MED ORDER — BUPIVACAINE LIPOSOME 1.3 % IJ SUSP
INTRAMUSCULAR | Status: DC | PRN
  Administered 2021-11-17: 10 mL via PERINEURAL

## 2021-11-17 MED ORDER — ROPIVACAINE HCL 5 MG/ML IJ SOLN
INTRAMUSCULAR | Status: DC | PRN
  Administered 2021-11-17: 15 mL via PERINEURAL

## 2021-11-17 MED ORDER — PHENYLEPHRINE HCL-NACL 20-0.9 MG/250ML-% IV SOLN
INTRAVENOUS | Status: DC | PRN
  Administered 2021-11-17: 25 ug/min via INTRAVENOUS

## 2021-11-17 MED ORDER — GABAPENTIN 300 MG PO CAPS
ORAL_CAPSULE | ORAL | Status: AC
Start: 2021-11-17 — End: ?
  Filled 2021-11-17: qty 1

## 2021-11-17 MED ORDER — ACETAMINOPHEN 500 MG PO TABS
ORAL_TABLET | ORAL | Status: AC
  Filled 2021-11-17: qty 2

## 2021-11-17 MED ORDER — PROPOFOL 10 MG/ML IV BOLUS
INTRAVENOUS | Status: AC
  Filled 2021-11-17: qty 20

## 2021-11-17 MED ORDER — EPHEDRINE SULFATE (PRESSORS) 50 MG/ML IJ SOLN
INTRAMUSCULAR | Status: DC | PRN
  Administered 2021-11-17: 5 mg via INTRAVENOUS
  Administered 2021-11-17: 10 mg via INTRAVENOUS

## 2021-11-17 MED ORDER — ONDANSETRON HCL 4 MG/2ML IJ SOLN
INTRAMUSCULAR | Status: AC
  Filled 2021-11-17: qty 2

## 2021-11-17 MED ORDER — CELECOXIB 100 MG PO CAPS: 100.0000 mg | ORAL_CAPSULE | Freq: Two times a day (BID) | ORAL | 0 refills | Status: AC

## 2021-11-17 MED ORDER — MEPERIDINE HCL 25 MG/ML IJ SOLN: 6.2500 mg | INTRAMUSCULAR | Status: DC | PRN

## 2021-11-17 MED ORDER — ACETAMINOPHEN 500 MG PO TABS: 1000.0000 mg | ORAL_TABLET | Freq: Once | ORAL | Status: AC

## 2021-11-17 MED ORDER — SUGAMMADEX SODIUM 200 MG/2ML IV SOLN
INTRAVENOUS | Status: DC | PRN
  Administered 2021-11-17: 175 mg via INTRAVENOUS

## 2021-11-17 MED ORDER — LIDOCAINE HCL (CARDIAC) PF 100 MG/5ML IV SOSY
PREFILLED_SYRINGE | INTRAVENOUS | Status: DC | PRN
  Administered 2021-11-17: 40 mg via INTRAVENOUS

## 2021-11-17 MED ORDER — MIDAZOLAM HCL 2 MG/2ML IJ SOLN
INTRAMUSCULAR | Status: AC
  Filled 2021-11-17: qty 2

## 2021-11-17 MED ORDER — ACETAMINOPHEN 500 MG PO TABS
1000.0000 mg | ORAL_TABLET | Freq: Once | ORAL | Status: AC
  Administered 2021-11-17: 1000 mg via ORAL

## 2021-11-17 MED ORDER — CEFAZOLIN SODIUM-DEXTROSE 2-4 GM/100ML-% IV SOLN
2.0000 g | INTRAVENOUS | Status: AC
  Administered 2021-11-17: 2 g via INTRAVENOUS

## 2021-11-17 MED ORDER — ONDANSETRON HCL 4 MG PO TABS: 4.0000 mg | ORAL_TABLET | Freq: Three times a day (TID) | ORAL | 0 refills | Status: AC | PRN

## 2021-11-17 MED ORDER — ONDANSETRON HCL 4 MG/2ML IJ SOLN: 4.0000 mg | Freq: Once | INTRAMUSCULAR | Status: DC | PRN

## 2021-11-17 MED ORDER — AMISULPRIDE (ANTIEMETIC) 5 MG/2ML IV SOLN
10.0000 mg | Freq: Once | INTRAVENOUS | Status: AC | PRN
  Administered 2021-11-17: 10 mg via INTRAVENOUS

## 2021-11-17 MED ORDER — ONDANSETRON HCL 4 MG/2ML IJ SOLN
INTRAMUSCULAR | Status: DC | PRN
  Administered 2021-11-17: 4 mg via INTRAVENOUS

## 2021-11-17 SURGICAL SUPPLY — 62 items
AID PSTN UNV HD RSTRNT DISP (MISCELLANEOUS) ×1
ANCH SUT 2 FBRTK KNTLS 1.8 (Anchor) ×3 IMPLANT
ANCH SUT SWLK 19.1X4.75 (Anchor) ×1 IMPLANT
ANCHOR SUT 1.8 FIBERTAK SB KL (Anchor) ×3 IMPLANT
ANCHOR SUT BIO SW 4.75X19.1 (Anchor) ×1 IMPLANT
APL PRP STRL LF DISP 70% ISPRP (MISCELLANEOUS) ×1
BLADE EXCALIBUR 4.0X13 (MISCELLANEOUS) ×2 IMPLANT
BURR OVAL 8 FLU 4.0X13 (MISCELLANEOUS) ×1 IMPLANT
CANNULA 5.75X71 LONG (CANNULA) IMPLANT
CANNULA PASSPORT 5 (CANNULA) ×1 IMPLANT
CANNULA PASSPORT BUTTON 10-40 (CANNULA) ×1 IMPLANT
CANNULA TWIST IN 8.25X7CM (CANNULA) ×1 IMPLANT
CHLORAPREP W/TINT 26 (MISCELLANEOUS) ×2 IMPLANT
CLSR STERI-STRIP ANTIMIC 1/2X4 (GAUZE/BANDAGES/DRESSINGS) ×2 IMPLANT
COOLER ICEMAN CLASSIC (MISCELLANEOUS) ×2 IMPLANT
DRAPE IMP U-DRAPE 54X76 (DRAPES) ×2 IMPLANT
DRAPE INCISE IOBAN 66X45 STRL (DRAPES) IMPLANT
DRAPE SHOULDER BEACH CHAIR (DRAPES) ×2 IMPLANT
DRSG PAD ABDOMINAL 8X10 ST (GAUZE/BANDAGES/DRESSINGS) ×2 IMPLANT
DW OUTFLOW CASSETTE/TUBE SET (MISCELLANEOUS) ×2 IMPLANT
GAUZE SPONGE 4X4 12PLY STRL (GAUZE/BANDAGES/DRESSINGS) ×2 IMPLANT
GLOVE BIO SURGEON STRL SZ 6.5 (GLOVE) ×2 IMPLANT
GLOVE BIOGEL PI IND STRL 6.5 (GLOVE) ×1 IMPLANT
GLOVE BIOGEL PI IND STRL 8 (GLOVE) ×1 IMPLANT
GLOVE BIOGEL PI INDICATOR 6.5 (GLOVE) ×1
GLOVE BIOGEL PI INDICATOR 8 (GLOVE) ×1
GLOVE ECLIPSE 8.0 STRL XLNG CF (GLOVE) ×2 IMPLANT
GOWN STRL REUS W/ TWL LRG LVL3 (GOWN DISPOSABLE) ×2 IMPLANT
GOWN STRL REUS W/TWL LRG LVL3 (GOWN DISPOSABLE) ×4
GOWN STRL REUS W/TWL XL LVL3 (GOWN DISPOSABLE) ×2 IMPLANT
KIT SHOULDER STAB MARCO (KITS) ×2 IMPLANT
KIT STR SPEAR 1.8 FBRTK DISP (KITS) ×1 IMPLANT
LASSO 90 CVE QUICKPAS (DISPOSABLE) IMPLANT
LASSO CRESCENT QUICKPASS (SUTURE) ×1 IMPLANT
MANIFOLD NEPTUNE II (INSTRUMENTS) ×2 IMPLANT
NDL SAFETY ECLIPSE 18X1.5 (NEEDLE) ×1 IMPLANT
NDL SCORPION MULTI FIRE (NEEDLE) IMPLANT
NEEDLE HYPO 18GX1.5 SHARP (NEEDLE) ×2
NEEDLE SCORPION MULTI FIRE (NEEDLE) IMPLANT
PACK ARTHROSCOPY DSU (CUSTOM PROCEDURE TRAY) ×2 IMPLANT
PACK BASIN DAY SURGERY FS (CUSTOM PROCEDURE TRAY) ×2 IMPLANT
PAD COLD SHLDR WRAP-ON (PAD) ×2 IMPLANT
PAD ORTHO SHOULDER 7X19 LRG (SOFTGOODS) ×2 IMPLANT
PORT APPOLLO RF 90DEGREE MULTI (SURGICAL WAND) ×2 IMPLANT
RESTRAINT HEAD UNIVERSAL NS (MISCELLANEOUS) ×2 IMPLANT
SHEET MEDIUM DRAPE 40X70 STRL (DRAPES) ×2 IMPLANT
SLEEVE SCD COMPRESS KNEE MED (STOCKING) ×2 IMPLANT
SLING ARM FOAM STRAP LRG (SOFTGOODS) IMPLANT
SUT FIBERWIRE #2 38 T-5 BLUE (SUTURE)
SUT MNCRL AB 4-0 PS2 18 (SUTURE) ×2 IMPLANT
SUT PDS AB 0 CT 36 (SUTURE) IMPLANT
SUT PDS AB 1 CT  36 (SUTURE)
SUT PDS AB 1 CT 36 (SUTURE) IMPLANT
SUT TIGER TAPE 7 IN WHITE (SUTURE) IMPLANT
SUTURE FIBERWR #2 38 T-5 BLUE (SUTURE) IMPLANT
SUTURE TAPE TIGERLINK 1.3MM BL (SUTURE) IMPLANT
SUTURETAPE TIGERLINK 1.3MM BL (SUTURE)
SYR 5ML LL (SYRINGE) ×2 IMPLANT
TAPE FIBER 2MM 7IN #2 BLUE (SUTURE) IMPLANT
TOWEL GREEN STERILE FF (TOWEL DISPOSABLE) ×4 IMPLANT
TUBE CONNECTING 20X1/4 (TUBING) ×2 IMPLANT
TUBING ARTHROSCOPY IRRIG 16FT (MISCELLANEOUS) ×2 IMPLANT

## 2021-11-17 NOTE — Anesthesia Procedure Notes (Signed)
Anesthesia Regional Block: Interscalene brachial plexus block   Pre-Anesthetic Checklist: , timeout performed,  Correct Patient, Correct Site, Correct Laterality,  Correct Procedure, Correct Position, site marked,  Risks and benefits discussed,  Surgical consent,  Pre-op evaluation,  At surgeon's request and post-op pain management  Laterality: Right  Prep: Maximum Sterile Barrier Precautions used, chloraprep       Needles:  Injection technique: Single-shot  Needle Type: Echogenic Stimulator Needle     Needle Length: 9cm  Needle Gauge: 22     Additional Needles:   Procedures:,,,, ultrasound used (permanent image in chart),,    Narrative:  Start time: 11/17/2021 9:55 AM End time: 11/17/2021 10:00 AM Injection made incrementally with aspirations every 5 mL.  Performed by: Personally  Anesthesiologist: Lannie Fields, DO  Additional Notes: Monitors applied. No increased pain on injection. No increased resistance to injection. Injection made in 5cc increments. Good needle visualization. Patient tolerated procedure well.

## 2021-11-17 NOTE — Transfer of Care (Signed)
Immediate Anesthesia Transfer of Care Note  Patient: Publishing rights manager  Procedure(s) Performed: SHOULDER ARTHROSCOPY WITH ROTATOR CUFF REPAIR, SUBACROMIAL DECOMPRESSION AND BICEPS TENODESIS (Right: Shoulder)  Patient Location: PACU  Anesthesia Type:General  Level of Consciousness: drowsy, patient cooperative and responds to stimulation  Airway & Oxygen Therapy: Patient Spontanous Breathing and Patient connected to face mask oxygen  Post-op Assessment: Report given to RN and Post -op Vital signs reviewed and stable  Post vital signs: Reviewed and stable  Last Vitals:  Vitals Value Taken Time  BP    Temp    Pulse    Resp    SpO2      Last Pain:  Vitals:   11/17/21 0906  TempSrc: Oral  PainSc: 0-No pain         Complications: No notable events documented.

## 2021-11-17 NOTE — Interval H&P Note (Signed)
All questions answered, patient wants to proceed with procedure. ? ?

## 2021-11-17 NOTE — Anesthesia Procedure Notes (Signed)
Procedure Name: Intubation Date/Time: 11/17/2021 10:48 AM  Performed by: Thornell Mule, CRNAPre-anesthesia Checklist: Patient identified, Emergency Drugs available, Suction available and Patient being monitored Patient Re-evaluated:Patient Re-evaluated prior to induction Oxygen Delivery Method: Circle system utilized Preoxygenation: Pre-oxygenation with 100% oxygen Induction Type: IV induction Ventilation: Mask ventilation without difficulty Laryngoscope Size: Miller and 3 Grade View: Grade I Tube type: Oral Tube size: 7.5 mm Number of attempts: 1 Airway Equipment and Method: Stylet and Oral airway Placement Confirmation: ETT inserted through vocal cords under direct vision, positive ETCO2 and breath sounds checked- equal and bilateral Secured at: 21 cm Tube secured with: Tape Dental Injury: Teeth and Oropharynx as per pre-operative assessment

## 2021-11-17 NOTE — Anesthesia Preprocedure Evaluation (Addendum)
Anesthesia Evaluation  Patient identified by MRN, date of birth, ID band Patient awake    Reviewed: Allergy & Precautions, NPO status , Patient's Chart, lab work & pertinent test results  History of Anesthesia Complications (+) history of anesthetic complications (shaking)  Airway Mallampati: II  TM Distance: >3 FB Neck ROM: Full    Dental no notable dental hx.    Pulmonary neg pulmonary ROS,    Pulmonary exam normal breath sounds clear to auscultation       Cardiovascular hypertension, Pt. on medications Normal cardiovascular exam Rhythm:Regular Rate:Normal     Neuro/Psych negative neurological ROS  negative psych ROS   GI/Hepatic negative GI ROS, (+)       alcohol use,   Endo/Other  negative endocrine ROS  Renal/GU Renal InsufficiencyRenal diseaseCr 1.31  negative genitourinary   Musculoskeletal negative musculoskeletal ROS (+)   Abdominal   Peds  Hematology negative hematology ROS (+)   Anesthesia Other Findings   Reproductive/Obstetrics negative OB ROS                            Anesthesia Physical Anesthesia Plan  ASA: 2  Anesthesia Plan: General and Regional   Post-op Pain Management: Regional block* and Tylenol PO (pre-op)*   Induction: Intravenous  PONV Risk Score and Plan: Ondansetron, Dexamethasone, Midazolam and Treatment may vary due to age or medical condition  Airway Management Planned: Oral ETT  Additional Equipment: None  Intra-op Plan:   Post-operative Plan: Extubation in OR  Informed Consent: I have reviewed the patients History and Physical, chart, labs and discussed the procedure including the risks, benefits and alternatives for the proposed anesthesia with the patient or authorized representative who has indicated his/her understanding and acceptance.     Dental advisory given  Plan Discussed with: CRNA  Anesthesia Plan Comments:         Anesthesia Quick Evaluation

## 2021-11-17 NOTE — Progress Notes (Signed)
Assisted Dr. Finucane with right, interscalene , ultrasound guided block. Side rails up, monitors on throughout procedure. See vital signs in flow sheet. Tolerated Procedure well. 

## 2021-11-17 NOTE — Anesthesia Postprocedure Evaluation (Signed)
Anesthesia Post Note  Patient: Publishing rights manager  Procedure(s) Performed: SHOULDER ARTHROSCOPY WITH ROTATOR CUFF REPAIR, SUBACROMIAL DECOMPRESSION AND BICEPS TENODESIS (Right: Shoulder)     Patient location during evaluation: PACU Anesthesia Type: Regional and General Level of consciousness: awake and alert, oriented and patient cooperative Pain management: pain level controlled Vital Signs Assessment: post-procedure vital signs reviewed and stable Respiratory status: spontaneous breathing, nonlabored ventilation and respiratory function stable Cardiovascular status: blood pressure returned to baseline and stable Postop Assessment: no apparent nausea or vomiting Anesthetic complications: no   No notable events documented.  Last Vitals:  Vitals:   11/17/21 1230 11/17/21 1245  BP: (!) 149/83 137/70  Pulse:  87  Resp: 13 12  Temp:    SpO2:  97%    Last Pain:  Vitals:   11/17/21 1228  TempSrc:   PainSc: 0-No pain                 Lannie Fields

## 2021-11-18 ENCOUNTER — Encounter (HOSPITAL_BASED_OUTPATIENT_CLINIC_OR_DEPARTMENT_OTHER): Payer: Self-pay | Admitting: Orthopaedic Surgery

## 2021-11-21 DIAGNOSIS — M6281 Muscle weakness (generalized): Secondary | ICD-10-CM | POA: Diagnosis not present

## 2021-11-21 DIAGNOSIS — M75121 Complete rotator cuff tear or rupture of right shoulder, not specified as traumatic: Secondary | ICD-10-CM | POA: Diagnosis not present

## 2021-11-21 DIAGNOSIS — S46211D Strain of muscle, fascia and tendon of other parts of biceps, right arm, subsequent encounter: Secondary | ICD-10-CM | POA: Diagnosis not present

## 2021-11-23 DIAGNOSIS — L249 Irritant contact dermatitis, unspecified cause: Secondary | ICD-10-CM | POA: Diagnosis not present

## 2021-11-23 DIAGNOSIS — D234 Other benign neoplasm of skin of scalp and neck: Secondary | ICD-10-CM | POA: Diagnosis not present

## 2021-11-23 DIAGNOSIS — Q825 Congenital non-neoplastic nevus: Secondary | ICD-10-CM | POA: Diagnosis not present

## 2021-11-23 DIAGNOSIS — L821 Other seborrheic keratosis: Secondary | ICD-10-CM | POA: Diagnosis not present

## 2021-11-24 DIAGNOSIS — S46211D Strain of muscle, fascia and tendon of other parts of biceps, right arm, subsequent encounter: Secondary | ICD-10-CM | POA: Diagnosis not present

## 2021-11-25 DIAGNOSIS — M6281 Muscle weakness (generalized): Secondary | ICD-10-CM | POA: Diagnosis not present

## 2021-11-25 DIAGNOSIS — S46211D Strain of muscle, fascia and tendon of other parts of biceps, right arm, subsequent encounter: Secondary | ICD-10-CM | POA: Diagnosis not present

## 2021-11-25 DIAGNOSIS — M75121 Complete rotator cuff tear or rupture of right shoulder, not specified as traumatic: Secondary | ICD-10-CM | POA: Diagnosis not present

## 2021-11-26 NOTE — Op Note (Signed)
Orthopaedic Surgery Operative Note (CSN: [id number])  [name]  09-14-1975 Date of Surgery: 11/17/2021   DIAGNOSES: Right shoulder, acute traumatic rotator cuff tear, SLAP tear, biceps tendinitis, and subacromial impingement.  POST-OPERATIVE DIAGNOSIS: same  PROCEDURE: Arthroscopic extensive debridement - 29823 Subdeltoid Bursa, Supraspinatus Tendon, Anterior Labrum, Superior Labrum, and MGHL Arthroscopic subacromial decompression - 32992 Arthroscopic rotator cuff repair - 42683 Arthroscopic biceps tenodesis - 41962   OPERATIVE FINDING: Exam under anesthesia: Normal Articular space: Significant synovitis anteriorly, redness in MGHL and rotator interval. Chondral surfaces: Normal Biceps:  Type 2 slap tear with intrasubstance tearing and clear redness throughout biceps tendon.  Tenodesis was done with 3 anchors to try and aid fixation as tendon was extremely poor quality.  Subscapularis: Complete tear of upper 50%.  We felt it was appropriate for a single anchor repair with the speed fix technique. Supraspinatus: Incomplete tear at leading edge, likely ~10% articular involvement.  We marked the tendon and were able to check on the bursal side.  Tendon was appropriate for debridement alone. Infraspinatus: Intact      Post-operative plan: The patient will be non-weightbearing in a sling .  The patient will be discharged home.  DVT prophylaxis not indicated in ambulatory upper extremity patient without known risk factors.   Pain control with PRN pain medication preferring oral medicines.  Follow up plan will be scheduled in approximately 7 days for incision check and XR.  Surgeons:Primary: Bjorn Pippin, MD Assistants:Caroline McBane PA-C Location: [facility] OR ROOM 1 Anesthesia: General with Exparel interscalene block Antibiotics: Ancef 2 g Tourniquet time: None Estimated Blood Loss: Minimal Complications: None Specimens: None Implants: Implant Name Type Inv. Item Serial No.  Manufacturer Lot No. LRB No. Used Action  ANCHOR SUT BIO SW 4.75X19.1 - [id number] Anchor ANCHOR SUT BIO SW 4.75X19.1  [employer] [id number] Right 1 Implanted  ANCHOR SUT 1.8 FIBERTAK SB KL - [id number] Anchor ANCHOR SUT 1.8 FIBERTAK SB KL  [employer] [id number] Right 1 Implanted  ANCHOR SUT 1.8 FIBERTAK SB KL - [id number] Anchor ANCHOR SUT 1.8 FIBERTAK SB KL  [employer] [id number] Right 1 Implanted  ANCHOR SUT 1.8 FIBERTAK SB KL - [id number] Anchor ANCHOR SUT 1.8 [employer] [id number] Right 1 Implanted    Indications for Surgery:   Jesus Baker is a 46 y.o. male with continued shoulder pain refractory to nonoperative measures for extended period of time.    The risks and benefits were explained at length including but not limited to continued pain, cuff failure, biceps tenodesis failure, stiffness, need for further surgery and infection.   Procedure:   Patient was correctly identified in the preoperative holding area and operative site marked.  Patient brought to OR and positioned beachchair on an Thornton table ensuring that all bony prominences were padded and the head was in an appropriate location.  Anesthesia was induced and the operative shoulder was prepped and draped in the usual sterile fashion.  Timeout was called preincision.  A standard posterior viewing portal was made after localizing the portal with a spinal needle.  An anterior accessory portal was also made.  After clearing the articular space the camera was positioned in the subacromial space.  Findings above.    Extensive debridement was performed of the anterior interval tissue, labral fraying and the bursa.  Biceps tenodesis: We marked the tendon and then performed a tenotomy and debridement of the stump in the articular space. We then identified the biceps tendon in its groove suprapec with  the arthroscope in the lateral portal taking care to move from lateral to medial to avoid injury to the subscapularis. At that point we  unroofed the tendon itself and mobilized it. An accessory anterior portal was made in line with the tendon and we grasped it from the anterior superior portal and worked from the accessory anterior portal. Two Fibertak 1.34mm knotless anchors were placed in the groove and the tendon was secured in a luggage loop style fashion with a pass of the limb of suture through the tendon using a scorpion device to avoid pull-through.  Repair was completed with good tension on the tendon.  Residual stump of the tendon was removed after being resected with a RF ablator.  We used a 3rd anchor to augument the repair as the tendon was of extremely poor quality.  This is quite atypical but it was of particular importance that the patient avoid a cosmetic deformity if possible and maintain full function.  Subacromial decompression: We made a lateral portal with spinal needle guidance. We then proceeded to debride bursal tissue extensively with a shaver and arthrocare device. At that point we continued to identify the borders of the acromion and identify the spur. We then carefully preserved the deltoid fascia and used a burr to convert the acromion to a Type 1 flat acromion without issue.  Subscapularis Repair: We identified a subscapularis tear that involved the upper 50%.  Using a grasping device were able to demonstrate that the tendon could be reapproximated to the lesser tuberosity.  We felt that it was repairable.  We then used a RF ablator to open the rotator interval and released the MGHL to allow further translation of the subscapularis.  This point we cleared both anterior and posterior to the tendon taking care to avoid inferior migration to avoid the neurovascular structures as well as the muscular cutaneous nerve.  We then used a lasso to pass a fiber tape in a mattress fashion through the subscapularis and based 4.75 swivel lock was used to repair back to the lesser tuberosity after prepping the tuberosity  extensively.  We had good approximation of the tendon and a recreation of the rolled border.   The incisions were closed with absorbable monocryl and steri strips.  A sterile dressing was placed along with a sling. The patient was awoken from general anesthesia and taken to the PACU in stable condition without complication.   Alfonse Alpers, PA-C, present and scrubbed throughout the case, critical for completion in a timely fashion, and for retraction, instrumentation, closure.

## 2021-11-28 DIAGNOSIS — M6281 Muscle weakness (generalized): Secondary | ICD-10-CM | POA: Diagnosis not present

## 2021-11-28 DIAGNOSIS — S46211D Strain of muscle, fascia and tendon of other parts of biceps, right arm, subsequent encounter: Secondary | ICD-10-CM | POA: Diagnosis not present

## 2021-11-28 DIAGNOSIS — M75121 Complete rotator cuff tear or rupture of right shoulder, not specified as traumatic: Secondary | ICD-10-CM | POA: Diagnosis not present

## 2021-12-02 DIAGNOSIS — M6281 Muscle weakness (generalized): Secondary | ICD-10-CM | POA: Diagnosis not present

## 2021-12-02 DIAGNOSIS — S46211D Strain of muscle, fascia and tendon of other parts of biceps, right arm, subsequent encounter: Secondary | ICD-10-CM | POA: Diagnosis not present

## 2021-12-02 DIAGNOSIS — M75121 Complete rotator cuff tear or rupture of right shoulder, not specified as traumatic: Secondary | ICD-10-CM | POA: Diagnosis not present

## 2021-12-05 DIAGNOSIS — S46211D Strain of muscle, fascia and tendon of other parts of biceps, right arm, subsequent encounter: Secondary | ICD-10-CM | POA: Diagnosis not present

## 2021-12-05 DIAGNOSIS — M6281 Muscle weakness (generalized): Secondary | ICD-10-CM | POA: Diagnosis not present

## 2021-12-05 DIAGNOSIS — M75121 Complete rotator cuff tear or rupture of right shoulder, not specified as traumatic: Secondary | ICD-10-CM | POA: Diagnosis not present

## 2021-12-08 DIAGNOSIS — S46211D Strain of muscle, fascia and tendon of other parts of biceps, right arm, subsequent encounter: Secondary | ICD-10-CM | POA: Diagnosis not present

## 2021-12-08 DIAGNOSIS — M6281 Muscle weakness (generalized): Secondary | ICD-10-CM | POA: Diagnosis not present

## 2021-12-08 DIAGNOSIS — M75121 Complete rotator cuff tear or rupture of right shoulder, not specified as traumatic: Secondary | ICD-10-CM | POA: Diagnosis not present

## 2021-12-12 DIAGNOSIS — M75121 Complete rotator cuff tear or rupture of right shoulder, not specified as traumatic: Secondary | ICD-10-CM | POA: Diagnosis not present

## 2021-12-12 DIAGNOSIS — M6281 Muscle weakness (generalized): Secondary | ICD-10-CM | POA: Diagnosis not present

## 2021-12-12 DIAGNOSIS — S46211D Strain of muscle, fascia and tendon of other parts of biceps, right arm, subsequent encounter: Secondary | ICD-10-CM | POA: Diagnosis not present

## 2021-12-14 ENCOUNTER — Other Ambulatory Visit: Payer: Self-pay | Admitting: Medical-Surgical

## 2021-12-14 DIAGNOSIS — I10 Essential (primary) hypertension: Secondary | ICD-10-CM

## 2021-12-15 NOTE — Telephone Encounter (Signed)
Patient needs a TOC appointment for further refills.  Last office visit 08/19/2020 with Lyn Hollingshead   Refill denied

## 2021-12-15 NOTE — Telephone Encounter (Signed)
Patient has been scheduled for TOC with Dr Tamera Punt on 12/21/21. AMUCK

## 2021-12-16 DIAGNOSIS — M6281 Muscle weakness (generalized): Secondary | ICD-10-CM | POA: Diagnosis not present

## 2021-12-16 DIAGNOSIS — S46211D Strain of muscle, fascia and tendon of other parts of biceps, right arm, subsequent encounter: Secondary | ICD-10-CM | POA: Diagnosis not present

## 2021-12-16 DIAGNOSIS — M75121 Complete rotator cuff tear or rupture of right shoulder, not specified as traumatic: Secondary | ICD-10-CM | POA: Diagnosis not present

## 2021-12-19 DIAGNOSIS — M6281 Muscle weakness (generalized): Secondary | ICD-10-CM | POA: Diagnosis not present

## 2021-12-19 DIAGNOSIS — S46211D Strain of muscle, fascia and tendon of other parts of biceps, right arm, subsequent encounter: Secondary | ICD-10-CM | POA: Diagnosis not present

## 2021-12-19 DIAGNOSIS — M75121 Complete rotator cuff tear or rupture of right shoulder, not specified as traumatic: Secondary | ICD-10-CM | POA: Diagnosis not present

## 2021-12-21 ENCOUNTER — Ambulatory Visit (INDEPENDENT_AMBULATORY_CARE_PROVIDER_SITE_OTHER): Payer: BC Managed Care – PPO | Admitting: Family Medicine

## 2021-12-21 DIAGNOSIS — I10 Essential (primary) hypertension: Secondary | ICD-10-CM | POA: Diagnosis not present

## 2021-12-21 MED ORDER — HYDROCHLOROTHIAZIDE 12.5 MG PO TABS: 12.5000 mg | ORAL_TABLET | Freq: Every day | ORAL | 3 refills | Status: DC

## 2021-12-21 NOTE — Progress Notes (Signed)
   Acute Office Visit  Subjective:     Patient ID: Jesus Baker, male    DOB: April 26, 1976, 46 y.o.   MRN: [id number]  Chief Complaint  Patient presents with   Establish Care    HPI Patient is in today to establish care. He was a previous patient of Dr. Lyn Hollingshead. He has a pmh of HTN and is currently on hctz 12.5mg . He has been doing well on this medication and needs a refill today. BP today is 128/77.   Review of Systems  Constitutional:  Negative for chills and fever.  Respiratory:  Negative for cough and shortness of breath.   Cardiovascular:  Negative for chest pain.  Neurological:  Negative for headaches.       Objective:    BP 128/77   Pulse 99   Ht 5\' 8"  (1.727 m)   Wt 179 lb (81.2 kg)   SpO2 99%   BMI 27.22 kg/m    Physical Exam Vitals and nursing note reviewed.  Constitutional:      General: He is not in acute distress.    Appearance: Normal appearance.  HENT:     Head: Normocephalic and atraumatic.     Right Ear: External ear normal.     Left Ear: External ear normal.     Nose: Nose normal.  Eyes:     Conjunctiva/sclera: Conjunctivae normal.  Cardiovascular:     Rate and Rhythm: Normal rate and regular rhythm.  Pulmonary:     Effort: Pulmonary effort is normal.     Breath sounds: Normal breath sounds.  Neurological:     General: No focal deficit present.     Mental Status: He is alert and oriented to person, place, and time.  Psychiatric:        Mood and Affect: Mood normal.        Behavior: Behavior normal.        Thought Content: Thought content normal.        Judgment: Judgment normal.     No results found for any visits on 12/21/21.      Assessment & Plan:   Problem List Items Addressed This Visit       Cardiovascular and Mediastinum   Primary hypertension    - hctz refilled - pt doing well - has an apt for full lipid profile and other blood work and have asked for a copy of lab work for our records as I would like to know what  electrolytes look like as well as kidney function with the hctz medication      Relevant Medications   hydrochlorothiazide (HYDRODIURIL) 12.5 MG tablet    Meds ordered this encounter  Medications   hydrochlorothiazide (HYDRODIURIL) 12.5 MG tablet    Sig: Take 1 tablet (12.5 mg total) by mouth daily. LAST REFILLS. NEEDS TO TRANSFER CARE TO NEW PCP    Dispense:  90 tablet    Refill:  3    Return in about 6 months (around 06/23/2022) for htn follow up.  06/25/2022, DO

## 2021-12-21 NOTE — Assessment & Plan Note (Addendum)
-   hctz refilled - pt doing well - has an apt for full lipid profile and other blood work and have asked for a copy of lab work for our records as I would like to know what electrolytes look like as well as kidney function with the hctz medication

## 2021-12-23 DIAGNOSIS — M75121 Complete rotator cuff tear or rupture of right shoulder, not specified as traumatic: Secondary | ICD-10-CM | POA: Diagnosis not present

## 2021-12-23 DIAGNOSIS — M6281 Muscle weakness (generalized): Secondary | ICD-10-CM | POA: Diagnosis not present

## 2021-12-23 DIAGNOSIS — S46211D Strain of muscle, fascia and tendon of other parts of biceps, right arm, subsequent encounter: Secondary | ICD-10-CM | POA: Diagnosis not present

## 2021-12-27 DIAGNOSIS — S46211D Strain of muscle, fascia and tendon of other parts of biceps, right arm, subsequent encounter: Secondary | ICD-10-CM | POA: Diagnosis not present

## 2021-12-27 DIAGNOSIS — M6281 Muscle weakness (generalized): Secondary | ICD-10-CM | POA: Diagnosis not present

## 2021-12-27 DIAGNOSIS — M75121 Complete rotator cuff tear or rupture of right shoulder, not specified as traumatic: Secondary | ICD-10-CM | POA: Diagnosis not present

## 2021-12-30 DIAGNOSIS — M75121 Complete rotator cuff tear or rupture of right shoulder, not specified as traumatic: Secondary | ICD-10-CM | POA: Diagnosis not present

## 2021-12-30 DIAGNOSIS — S46211D Strain of muscle, fascia and tendon of other parts of biceps, right arm, subsequent encounter: Secondary | ICD-10-CM | POA: Diagnosis not present

## 2021-12-30 DIAGNOSIS — M6281 Muscle weakness (generalized): Secondary | ICD-10-CM | POA: Diagnosis not present

## 2022-01-02 DIAGNOSIS — S46211D Strain of muscle, fascia and tendon of other parts of biceps, right arm, subsequent encounter: Secondary | ICD-10-CM | POA: Diagnosis not present

## 2022-01-02 DIAGNOSIS — M6281 Muscle weakness (generalized): Secondary | ICD-10-CM | POA: Diagnosis not present

## 2022-01-02 DIAGNOSIS — M75121 Complete rotator cuff tear or rupture of right shoulder, not specified as traumatic: Secondary | ICD-10-CM | POA: Diagnosis not present

## 2022-01-06 DIAGNOSIS — S46211D Strain of muscle, fascia and tendon of other parts of biceps, right arm, subsequent encounter: Secondary | ICD-10-CM | POA: Diagnosis not present

## 2022-01-06 DIAGNOSIS — M75121 Complete rotator cuff tear or rupture of right shoulder, not specified as traumatic: Secondary | ICD-10-CM | POA: Diagnosis not present

## 2022-01-06 DIAGNOSIS — M6281 Muscle weakness (generalized): Secondary | ICD-10-CM | POA: Diagnosis not present

## 2022-01-10 DIAGNOSIS — Z23 Encounter for immunization: Secondary | ICD-10-CM | POA: Diagnosis not present

## 2022-01-11 DIAGNOSIS — M75121 Complete rotator cuff tear or rupture of right shoulder, not specified as traumatic: Secondary | ICD-10-CM | POA: Diagnosis not present

## 2022-01-11 DIAGNOSIS — M6281 Muscle weakness (generalized): Secondary | ICD-10-CM | POA: Diagnosis not present

## 2022-01-11 DIAGNOSIS — S46211D Strain of muscle, fascia and tendon of other parts of biceps, right arm, subsequent encounter: Secondary | ICD-10-CM | POA: Diagnosis not present

## 2022-01-17 DIAGNOSIS — M6281 Muscle weakness (generalized): Secondary | ICD-10-CM | POA: Diagnosis not present

## 2022-01-17 DIAGNOSIS — S46211D Strain of muscle, fascia and tendon of other parts of biceps, right arm, subsequent encounter: Secondary | ICD-10-CM | POA: Diagnosis not present

## 2022-01-17 DIAGNOSIS — M75121 Complete rotator cuff tear or rupture of right shoulder, not specified as traumatic: Secondary | ICD-10-CM | POA: Diagnosis not present

## 2022-01-24 DIAGNOSIS — M75121 Complete rotator cuff tear or rupture of right shoulder, not specified as traumatic: Secondary | ICD-10-CM | POA: Diagnosis not present

## 2022-01-24 DIAGNOSIS — M6281 Muscle weakness (generalized): Secondary | ICD-10-CM | POA: Diagnosis not present

## 2022-01-24 DIAGNOSIS — S46211D Strain of muscle, fascia and tendon of other parts of biceps, right arm, subsequent encounter: Secondary | ICD-10-CM | POA: Diagnosis not present

## 2022-02-01 DIAGNOSIS — S46211D Strain of muscle, fascia and tendon of other parts of biceps, right arm, subsequent encounter: Secondary | ICD-10-CM | POA: Diagnosis not present

## 2022-02-01 DIAGNOSIS — M75121 Complete rotator cuff tear or rupture of right shoulder, not specified as traumatic: Secondary | ICD-10-CM | POA: Diagnosis not present

## 2022-02-01 DIAGNOSIS — M6281 Muscle weakness (generalized): Secondary | ICD-10-CM | POA: Diagnosis not present

## 2022-04-11 DIAGNOSIS — S46211D Strain of muscle, fascia and tendon of other parts of biceps, right arm, subsequent encounter: Secondary | ICD-10-CM | POA: Diagnosis not present

## 2022-04-11 DIAGNOSIS — M75121 Complete rotator cuff tear or rupture of right shoulder, not specified as traumatic: Secondary | ICD-10-CM | POA: Diagnosis not present

## 2022-04-11 DIAGNOSIS — M6281 Muscle weakness (generalized): Secondary | ICD-10-CM | POA: Diagnosis not present

## 2022-06-20 ENCOUNTER — Encounter: Payer: Self-pay | Admitting: Family Medicine

## 2022-06-26 ENCOUNTER — Encounter: Payer: Self-pay | Admitting: Medical-Surgical

## 2022-06-26 ENCOUNTER — Telehealth (INDEPENDENT_AMBULATORY_CARE_PROVIDER_SITE_OTHER): Payer: BC Managed Care – PPO | Admitting: Medical-Surgical

## 2022-06-26 ENCOUNTER — Ambulatory Visit: Payer: BC Managed Care – PPO | Admitting: Medical-Surgical

## 2022-06-26 DIAGNOSIS — R7401 Elevation of levels of liver transaminase levels: Secondary | ICD-10-CM | POA: Diagnosis not present

## 2022-06-26 DIAGNOSIS — R946 Abnormal results of thyroid function studies: Secondary | ICD-10-CM | POA: Diagnosis not present

## 2022-06-26 MED ORDER — REPATHA 140 MG/ML ~~LOC~~ SOSY
140.0000 mg | PREFILLED_SYRINGE | SUBCUTANEOUS | 5 refills | Status: DC
Start: 2022-06-26 — End: 2022-12-26

## 2022-06-26 NOTE — Progress Notes (Signed)
Virtual Visit via Video Note  I connected with Jesus Baker on 06/26/22 at 11:10 AM EST by a video enabled telemedicine application and verified that I am speaking with the correct person using two identifiers.   I discussed the limitations of evaluation and management by telemedicine and the availability of in person appointments. The patient expressed understanding and agreed to proceed.  Patient location: home Provider locations: office  Subjective:    CC: review labs  HPI: Pleasant 47 year old male presenting via MyChart video visit today to discuss recent labs collected at [facility].  These were ordered by his regenerative medicine provider Dr. Linton Rump.  He has not been able to discuss the results with Dr. Linton Rump yet but wanted to touch base with his PCP regarding some of the results that were concerning.  His PCP is out on maternity leave and will be back in about a month.  He is most concerned about his liver enzymes being mildly elevated as well as the high cholesterol numbers.  Notes a very strong family history of cholesterol issues as well as statin intolerance.  Currently taking fish oil however is not on any prescribed cholesterol reducing medications.  Recently started taking co-Q10 as well as berberine to help while he was waiting to discuss results.  Also concerned with the elevations in thyroid antibodies.  His TSH, free T4, and free T3 came back normal however the TPO level was at 53.  He notes that his entire family was sick around the time these labs were done and wonders if that may have contributed to the abnormal thyroid labs.   Past medical history, Surgical history, Family history not pertinant except as noted below, Social history, Allergies, and medications have been entered into the medical record, reviewed, and corrections made.   Review of Systems: See HPI for pertinent positives and negatives.   Objective:    General: Speaking clearly in complete sentences without any  shortness of breath.  Alert and oriented x3.  Normal judgment. No apparent acute distress.  Impression and Recommendations:    1. Abnormal thyroid function test Unclear if a viral illness contributed to abnormal findings in the thyroid function testing.  Will plan to recheck in 3 to 4 weeks for verification. - TSH - T4, free - Thyroid peroxidase antibody  2. Transaminitis Discussed various findings that could contribute to transaminitis.  Not currently on any medications outside of hydrochlorothiazide that could cause this.  We did review that fatty liver disease, dehydration, Tylenol use, and alcohol intake could also contribute.  In his case, it does not seem that these are likely as he does not use Tylenol and drinks alcohol very rarely.  Admits that he might have been dehydrated.  Lets plan to recheck CMP in 3-4 weeks as well. - COMPLETE METABOLIC PANEL WITH GFR  3.  Hyperlipidemia Significantly elevated LDL cholesterol does lead toward a familial cause.  He is very leery of statins due to multiple family members with intolerances and severe side effects.  Interested in alternative options.  Okay to continue fish oil, co-Q10, and berberine if desired.  We will see if Repatha 140 mg every 2 weeks is covered by insurance.  If not, may qualify for Praluent.  Discussed the possibility that insurance will cover neither of these since he has not tried and failed statin medications.  At that time, we will get more information from the insurance regarding step therapy and requirements before injectables are covered.  Patient verbalized understanding and is  agreeable to the plan.  I discussed the assessment and treatment plan with the patient. The patient was provided an opportunity to ask questions and all were answered. The patient agreed with the plan and demonstrated an understanding of the instructions.   The patient was advised to call back or seek an in-person evaluation if the symptoms worsen  or if the condition fails to improve as anticipated.  25 minutes of non-face-to-face time was provided during this encounter.  Return for Labs in 3-4 weeks.  Orders in.Clearnce Sorrel, DNP, APRN, FNP-BC [facility]

## 2022-06-27 ENCOUNTER — Telehealth: Payer: Self-pay

## 2022-06-27 NOTE — Telephone Encounter (Signed)
Initiated Prior authorization [id number] 140MG/ML syringes Via: Covermymeds Case/Key: [id number] Status: approved  as of 06/27/22 Reason:Effective from 06/26/2022 through 06/25/2023. Notified Pt via: Mychart

## 2022-07-05 ENCOUNTER — Telehealth: Payer: BC Managed Care – PPO | Admitting: Nurse Practitioner

## 2022-07-05 ENCOUNTER — Telehealth: Payer: BC Managed Care – PPO

## 2022-07-05 DIAGNOSIS — J069 Acute upper respiratory infection, unspecified: Secondary | ICD-10-CM | POA: Diagnosis not present

## 2022-07-05 MED ORDER — FLUTICASONE PROPIONATE 50 MCG/ACT NA SUSP: 2.0000 | Freq: Every day | NASAL | 6 refills | Status: DC

## 2022-07-05 MED ORDER — AZITHROMYCIN 250 MG PO TABS: ORAL_TABLET | ORAL | 0 refills | Status: DC

## 2022-07-05 NOTE — Progress Notes (Signed)
Virtual Visit Consent   Jesus Baker, you are scheduled for a virtual visit with Mary-Margaret Hassell Done, [place], a [facility] provider, today.     Just as with appointments in the office, your consent must be obtained to participate.  Your consent will be active for this visit and any virtual visit you may have with one of our providers in the next 365 days.     If you have a MyChart account, a copy of this consent can be sent to you electronically.  All virtual visits are billed to your insurance company just like a traditional visit in the office.    As this is a virtual visit, video technology does not allow for your provider to perform a traditional examination.  This may limit your provider's ability to fully assess your condition.  If your provider identifies any concerns that need to be evaluated in person or the need to arrange testing (such as labs, EKG, etc.), we will make arrangements to do so.     Although advances in technology are sophisticated, we cannot ensure that it will always work on either your end or our end.  If the connection with a video visit is poor, the visit may have to be switched to a telephone visit.  With either a video or telephone visit, we are not always able to ensure that we have a secure connection.     I need to obtain your verbal consent now.   Are you willing to proceed with your visit today? YES   Jesus Baker has provided verbal consent on 07/05/2022 for a virtual visit (video or telephone).   Mary-Margaret Hassell Done, FNP   Date: 07/05/2022 9:57 AM   Virtual Visit via Video Note   I, Mary-Margaret Hassell Done, connected with Jesus Baker ([id number], 08/27/1975) on 07/05/22 at 10:00 AM EST by a video-enabled telemedicine application and verified that I am speaking with the correct person using two identifiers.  Location: Patient: Virtual Visit Location Patient: Home Provider: Virtual Visit Location Provider: Mobile   I discussed the limitations of evaluation and  management by telemedicine and the availability of in person appointments. The patient expressed understanding and agreed to proceed.    History of Present Illness: Jesus Baker is a 47 y.o. who identifies as a male who was assigned male at birth, and is being seen today for uri .  HPI: Keeping h I'm up a night. He has a neuro surgical cnference next week and needs to feel better.  URI  This is a new problem. The current episode started in the past 7 days. The problem has been waxing and waning. There has been no fever. Associated symptoms include congestion, coughing, rhinorrhea and a sore throat. He has tried acetaminophen for the symptoms. The treatment provided moderate relief.    Review of Systems  HENT:  Positive for congestion, rhinorrhea and sore throat.   Respiratory:  Positive for cough.     Problems:  Patient Active Problem List   Diagnosis Date Noted   Family history of prostate cancer 08/19/2020   Primary hypertension 08/19/2020   H. 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 LAST REFILLS. NEEDS TO TRANSFER CARE TO NEW PCP, Disp: 90 tablet, Rfl: 3   MAGNESIUM PO, Take by mouth., Disp: , Rfl:    modafinil (PROVIGIL) 200 MG tablet, Take 0.5-1 tablets  (100-200 mg total) by mouth daily as needed., Disp: 30 tablet, Rfl: 0   Multiple Vitamin (MULTIVITAMIN) capsule, Take 1 capsule by mouth daily., Disp: , Rfl:    Omega-3 Fatty Acids (FISH OIL) 1000 MG CAPS, Take 1 capsule by mouth daily., Disp: , Rfl:    selenium sulfide (SELSUN) 2.5 % shampoo, APPLY 1 APPLICATION TOPICALLY FOR 7 DAYS, Disp: 118 mL, Rfl: 12   Turmeric (QC TUMERIC COMPLEX PO), Take by mouth., Disp: , Rfl:   Observations/Objective: Patient is well-developed, well-nourished in no acute distress.  Resting comfortably  at home.  Head is normocephalic, atraumatic.  No labored breathing.  Speech is clear and coherent with logical content.  Patient is alert and oriented at baseline.  Sinus pressure bil   Assessment and Plan:  Jesus Baker in today with chief complaint of No chief complaint on file.   1. URI with cough and congestion 1. Take meds as prescribed 2. Use a cool mist humidifier especially during the winter months and when heat has been humid. 3. Use saline nose sprays frequently 4. Saline irrigations of the nose can be very helpful if done frequently.  * 4X daily for 1 week*  * Use of a nettie pot can be helpful with this. Follow directions with this* 5. Drink plenty of fluids 6. Keep thermostat turn down low 7.For any cough or congestion- robitussin DM 8. For fever or aces or pains- take tylenol or ibuprofen appropriate for age and weight.  * for fevers greater than 101 orally you may alternate ibuprofen and tylenol every  3 hours.     Meds ordered this encounter  Medications   azithromycin (ZITHROMAX Z-PAK) 250 MG tablet    Sig: As directed    Dispense:  6 tablet    Refill:  0    Order Specific Question:   Supervising Provider    Answer:   Chase Picket [id number]   fluticasone (FLONASE) 50 MCG/ACT nasal spray    Sig: Place 2 sprays into both nostrils daily.    Dispense:  16 g    Refill:  6    Order Specific Question:   Supervising Provider     Answer:   Chase Picket [id number]       Follow Up Instructions: I discussed the assessment and treatment plan with the patient. The patient was provided an opportunity to ask questions and all were answered. The patient agreed with the plan and demonstrated an understanding of the instructions.  A copy of instructions were sent to the patient via MyChart.  The patient was advised to call back or seek an in-person evaluation if the symptoms worsen or if the condition fails to improve as anticipated.  Time:  I spent 7 minutes with the patient via telehealth technology discussing the above problems/concerns.    Mary-Margaret Hassell Done, FNP

## 2022-07-05 NOTE — Patient Instructions (Signed)
Jesus Baker, thank you for joining Chevis Pretty, FNP for today's virtual visit.  While this provider is not your primary care provider (PCP), if your PCP is located in our provider database this encounter information will be shared with them immediately following your visit.   [place] account gives you access to today's visit and all your visits, tests, and labs performed at [facility] " click here if you don't have a [facility] account or go to [email or web address]  Consent: (Patient) Jesus Baker provided verbal consent for this virtual visit at the beginning of the encounter.  Current Medications:  Current Outpatient Medications:    azithromycin (ZITHROMAX Z-PAK) 250 MG tablet, As directed, Disp: 6 tablet, Rfl: 0   fluticasone (FLONASE) 50 MCG/ACT nasal spray, Place 2 sprays into both nostrils daily., Disp: 16 g, Rfl: 6   Cholecalciferol (VITAMIN D-3 PO), Take 1 capsule by mouth daily., Disp: , Rfl:    Evolocumab (REPATHA) 140 MG/ML SOSY, Inject 140 mg into the skin every 14 (fourteen) days., Disp: 2.1 mL, Rfl: 5   hydrochlorothiazide (HYDRODIURIL) 12.5 MG tablet, Take 1 tablet (12.5 mg total) by mouth daily. LAST REFILLS. NEEDS TO TRANSFER CARE TO NEW PCP, Disp: 90 tablet, Rfl: 3   MAGNESIUM PO, Take by mouth., Disp: , Rfl:    modafinil (PROVIGIL) 200 MG tablet, Take 0.5-1 tablets (100-200 mg total) by mouth daily as needed., Disp: 30 tablet, Rfl: 0   Multiple Vitamin (MULTIVITAMIN) capsule, Take 1 capsule by mouth daily., Disp: , Rfl:    Omega-3 Fatty Acids (FISH OIL) 1000 MG CAPS, Take 1 capsule by mouth daily., Disp: , Rfl:    selenium sulfide (SELSUN) 2.5 % shampoo, APPLY 1 APPLICATION TOPICALLY FOR 7 DAYS, Disp: 118 mL, Rfl: 12   Turmeric (QC TUMERIC COMPLEX PO), Take by mouth., Disp: , Rfl:    Medications ordered in this encounter:  Meds ordered this encounter  Medications   azithromycin (ZITHROMAX Z-PAK) 250 MG tablet    Sig: As  directed    Dispense:  6 tablet    Refill:  0    Order Specific Question:   Supervising Provider    Answer:   Chase Picket [id number]   fluticasone (FLONASE) 50 MCG/ACT nasal spray    Sig: Place 2 sprays into both nostrils daily.    Dispense:  16 g    Refill:  6    Order Specific Question:   Supervising Provider    Answer:   Chase Picket [id number]     *If you need refills on other medications prior to your next appointment, please contact your pharmacy*  Follow-Up: Call back or seek an in-person evaluation if the symptoms worsen or if the condition fails to improve as anticipated.  [place] [phone]  Other Instructions 1. Take meds as prescribed 2. Use a cool mist humidifier especially during the winter months and when heat has been humid. 3. Use saline nose sprays frequently 4. Saline irrigations of the nose can be very helpful if done frequently.  * 4X daily for 1 week*  * Use of a nettie pot can be helpful with this. Follow directions with this* 5. Drink plenty of fluids 6. Keep thermostat turn down low 7.For any cough or congestion- robiturrin DM 8. For fever or aces or pains- take tylenol or ibuprofen appropriate for age and weight.  * for fevers greater than 101 orally you may alternate ibuprofen and tylenol every  3 hours.  If you have been instructed to have an in-person evaluation today at a local Urgent Care facility, please use the link below. It will take you to a list of all of our available [facility], including address, phone number and hours of operation. Please do not delay care.  [facility]  If you or a family member do not have a primary care provider, use the link below to schedule a visit and establish care. When you choose a [facility] primary care physician or advanced practice provider, you gain a long-term partner in health. Find a Primary Care Provider  Learn more about [facility]  in-office and virtual care options: [facility] [facility]

## 2022-07-17 ENCOUNTER — Encounter: Payer: Self-pay | Admitting: Family Medicine

## 2022-07-17 ENCOUNTER — Ambulatory Visit: Payer: BC Managed Care – PPO | Admitting: Family Medicine

## 2022-07-17 VITALS — BP 140/71 | HR 63 | Ht 68.0 in | Wt 180.1 lb

## 2022-07-17 DIAGNOSIS — I10 Essential (primary) hypertension: Secondary | ICD-10-CM

## 2022-07-17 DIAGNOSIS — R946 Abnormal results of thyroid function studies: Secondary | ICD-10-CM

## 2022-07-17 DIAGNOSIS — E7849 Other hyperlipidemia: Secondary | ICD-10-CM | POA: Insufficient documentation

## 2022-07-17 DIAGNOSIS — Z129 Encounter for screening for malignant neoplasm, site unspecified: Secondary | ICD-10-CM | POA: Diagnosis not present

## 2022-07-17 DIAGNOSIS — B360 Pityriasis versicolor: Secondary | ICD-10-CM | POA: Diagnosis not present

## 2022-07-17 DIAGNOSIS — D2239 Melanocytic nevi of other parts of face: Secondary | ICD-10-CM | POA: Diagnosis not present

## 2022-07-17 NOTE — Patient Instructions (Signed)
Start Taking Repatha   - we will get a microalbumin/cr ratio next visit   -upload blood work for next visit   -next visit in 6 months   Great to see you!

## 2022-07-17 NOTE — Assessment & Plan Note (Signed)
-   pt doing well on hctz 12.5mg   - brought bloodwork from previous specialist doctor from 06/27/22. Cr slightly elevated at 1.36-pt likely dehydrated during time of blood work  - will continue hctz 12.5mg   - if BP elevated at next visit will need to increase medication or add on arb  - will get microalbumin/cr ratio at next visit to watch for microalbuminuria

## 2022-07-17 NOTE — Progress Notes (Signed)
Established patient visit   Patient: Jesus Baker   DOB: 07/22/1975   47 y.o. Male  MRN: [id number] Visit Date: 07/17/2022  Today's healthcare provider: Owens Loffler, DO   Chief Complaint  Patient presents with   Follow-up    Blood pressure     SUBJECTIVE    Chief Complaint  Patient presents with   Follow-up    Blood pressure    HPI HPI     Follow-up    Additional comments: Blood pressure       Last edited by Myrlene Broker, [place] on 07/17/2022  9:03 AM.      Pt presents for blood pressure follow up as well as hyperlipidemia.   HTN Pt currently on hctz 12.'5mg'$ . BP has been well controlled   HLD Pt approved for repatha. Was hesitant to start statins due to family intolerance.   Abnormal thyroid labs Pt has elevated TPO but was undergoing a viral illness at the time.   Prostate Cancer: maternal grandfather prostate cancer, maternal uncles-prostate cancer  Last checked in 06/20/2022 and was normal   Review of Systems  Constitutional:  Negative for activity change, fatigue and fever.  Respiratory:  Negative for cough and shortness of breath.   Cardiovascular:  Negative for chest pain.  Gastrointestinal:  Negative for abdominal pain.  Genitourinary:  Negative for difficulty urinating.       Current Meds  Medication Sig   Cholecalciferol (VITAMIN D-3 PO) Take 1 capsule by mouth daily.   hydrochlorothiazide (HYDRODIURIL) 12.5 MG tablet Take 1 tablet (12.5 mg total) by mouth daily. LAST REFILLS. NEEDS TO TRANSFER CARE TO NEW PCP   MAGNESIUM PO Take by mouth.   modafinil (PROVIGIL) 200 MG tablet Take 0.5-1 tablets (100-200 mg total) by mouth daily as needed.   Multiple Vitamin (MULTIVITAMIN) capsule Take 1 capsule by mouth daily.   Omega-3 Fatty Acids (FISH OIL) 1000 MG CAPS Take 1 capsule by mouth daily.   selenium sulfide (SELSUN) 2.5 % shampoo APPLY 1 APPLICATION TOPICALLY FOR 7 DAYS   Turmeric (QC TUMERIC COMPLEX PO) Take by mouth.    OBJECTIVE    BP (!)  140/71 (BP Location: Left Arm, Patient Position: Sitting, Cuff Size: Normal)   Pulse 63   Ht '5\' 8"'$  (1.727 m)   Wt 180 lb 1.9 oz (81.7 kg)   SpO2 97%   BMI 27.39 kg/m   Physical Exam Vitals and nursing note reviewed.  Constitutional:      General: He is not in acute distress.    Appearance: Normal appearance.  HENT:     Head: Normocephalic and atraumatic.     Right Ear: External ear normal.     Left Ear: External ear normal.     Nose: Nose normal.  Eyes:     Conjunctiva/sclera: Conjunctivae normal.  Cardiovascular:     Rate and Rhythm: Normal rate and regular rhythm.  Pulmonary:     Effort: Pulmonary effort is normal.     Breath sounds: Normal breath sounds.  Neurological:     General: No focal deficit present.     Mental Status: He is alert and oriented to person, place, and time.  Psychiatric:        Mood and Affect: Mood normal.        Behavior: Behavior normal.        Thought Content: Thought content normal.        Judgment: Judgment normal.  ASSESSMENT & PLAN    Problem List Items Addressed This Visit       Cardiovascular and Mediastinum   Primary hypertension - Primary    - pt doing well on hctz 12.'5mg'$   - brought bloodwork from previous specialist doctor from 06/27/22. Cr slightly elevated at 1.36-pt likely dehydrated during time of blood work  - will continue hctz 12.'5mg'$   - if BP elevated at next visit will need to increase medication or add on arb  - will get microalbumin/cr ratio at next visit to watch for microalbuminuria          Other   Familial hyperlipidemia    - per pt uploaded blood work he has elevated apo-B  - has been approved for repatha, but has not yet started it due to issues with picking it up at the pharmacy  - pt will pick up medication and start today        Abnormal thyroid function test    - elevated TPO with normal t3, t4  - likely elevated during viral illness. Pt would like to hold off on repeating today. It is  likely that without any changes in t3 and t4 we can hold off repeat testing until next visit if needed        Return in about 6 months (around 01/17/2023).      No orders of the defined types were placed in this encounter.   No orders of the defined types were placed in this encounter.    Owens Loffler, DO  [place] at [facility] [phone] (phone) [phone] (fax)  [facility]

## 2022-07-17 NOTE — Assessment & Plan Note (Signed)
-   elevated TPO with normal t3, t4  - likely elevated during viral illness. Pt would like to hold off on repeating today. It is likely that without any changes in t3 and t4 we can hold off repeat testing until next visit if needed

## 2022-07-17 NOTE — Assessment & Plan Note (Signed)
-   per pt uploaded blood work he has elevated apo-B  - has been approved for repatha, but has not yet started it due to issues with picking it up at the pharmacy  - pt will pick up medication and start today

## 2022-08-28 ENCOUNTER — Encounter: Payer: Self-pay | Admitting: Medical-Surgical

## 2022-08-29 ENCOUNTER — Other Ambulatory Visit: Payer: Self-pay | Admitting: Family Medicine

## 2022-08-29 MED ORDER — HYDROCHLOROTHIAZIDE 25 MG PO TABS: 25.0000 mg | ORAL_TABLET | Freq: Every day | ORAL | 3 refills | Status: DC

## 2022-10-18 DIAGNOSIS — H00022 Hordeolum internum right lower eyelid: Secondary | ICD-10-CM | POA: Diagnosis not present

## 2022-11-04 DIAGNOSIS — H0012 Chalazion right lower eyelid: Secondary | ICD-10-CM | POA: Diagnosis not present

## 2022-11-06 DIAGNOSIS — H00022 Hordeolum internum right lower eyelid: Secondary | ICD-10-CM | POA: Diagnosis not present

## 2022-11-27 ENCOUNTER — Encounter: Payer: Self-pay | Admitting: Family Medicine

## 2022-12-18 ENCOUNTER — Other Ambulatory Visit: Payer: Self-pay | Admitting: Family Medicine

## 2022-12-18 DIAGNOSIS — I10 Essential (primary) hypertension: Secondary | ICD-10-CM

## 2022-12-26 ENCOUNTER — Other Ambulatory Visit: Payer: Self-pay | Admitting: Medical-Surgical

## 2022-12-28 ENCOUNTER — Other Ambulatory Visit: Payer: Self-pay | Admitting: Family Medicine

## 2023-01-10 ENCOUNTER — Encounter: Payer: Self-pay | Admitting: Family Medicine

## 2023-01-11 ENCOUNTER — Ambulatory Visit (INDEPENDENT_AMBULATORY_CARE_PROVIDER_SITE_OTHER): Payer: BC Managed Care – PPO

## 2023-01-11 ENCOUNTER — Encounter: Payer: Self-pay | Admitting: Podiatry

## 2023-01-11 ENCOUNTER — Other Ambulatory Visit: Payer: Self-pay

## 2023-01-11 ENCOUNTER — Ambulatory Visit: Payer: BC Managed Care – PPO | Admitting: Podiatry

## 2023-01-11 DIAGNOSIS — M79672 Pain in left foot: Secondary | ICD-10-CM

## 2023-01-11 DIAGNOSIS — M722 Plantar fascial fibromatosis: Secondary | ICD-10-CM

## 2023-01-11 NOTE — Patient Instructions (Signed)

## 2023-01-11 NOTE — Progress Notes (Signed)
  Subjective:  Patient ID: Jesus Baker, male    DOB: 1975/05/30,   MRN: [id number]  Chief Complaint  Patient presents with   Foot Pain    Pt present present today for left heel pain that started about two weeks ago and it has gotten worse.     47 y.o. male presents for concern of left heel pain that has been going on for about two weeks. Relates first steps after rest or morning are the most painful. Relates most of the pain is in the bottom of his heel. Denies any current treatments. He does do a lot of stretching and walks a lot for work. He is a medical device rep and often in OR.  . Denies any other pedal complaints. Denies n/v/f/c.   Past Medical History:  Diagnosis Date   Complication of anesthesia    Patient reports "bad shakes" after anesthesia   HTN (hypertension)     Objective:  Physical Exam: Vascular: DP/PT pulses 2/4 bilateral. CFT <3 seconds. Normal hair growth on digits. No edema.  Skin. No lacerations or abrasions bilateral feet.  Musculoskeletal: MMT 5/5 bilateral lower extremities in DF, PF, Inversion and Eversion. Deceased ROM in DF of ankle joint. Tender to lateral calcaneal tubercle on the left. No pain along arch, PT or achilles. No pain with calcaneal squeeze.  Neurological: Sensation intact to light touch.   Assessment:   1. Plantar fasciitis, left      Plan:  Patient was evaluated and treated and all questions answered. Discussed plantar fasciitis with patient.  X-rays reviewed and discussed with patient. No acute fractures or dislocations noted. Mild spurring noted at inferior calcaneus.  Discussed treatment options including, ice, NSAIDS, supportive shoes, bracing, and stretching. Stretching exercises provided to be done on a daily basis.   Prescription for meloxicam patient has at home and advised on how to take for this.  PF brace dispensed. .   Follow-up 6 weeks or sooner if any problems arise. In the meantime, encouraged to call the office with any  questions, concerns, change in symptoms.    Louann Sjogren, DPM

## 2023-01-17 ENCOUNTER — Ambulatory Visit: Payer: BC Managed Care – PPO | Admitting: Family Medicine

## 2023-01-29 ENCOUNTER — Other Ambulatory Visit: Payer: Self-pay | Admitting: Medical-Surgical

## 2023-02-01 NOTE — Telephone Encounter (Signed)
Patient informed and scheduled for Feb 06, 2023.

## 2023-02-02 MED ORDER — REPATHA 140 MG/ML ~~LOC~~ SOSY
140.0000 mg | PREFILLED_SYRINGE | SUBCUTANEOUS | 0 refills | Status: DC
Start: 2023-02-02 — End: 2023-02-07

## 2023-02-06 ENCOUNTER — Ambulatory Visit: Payer: BC Managed Care – PPO | Admitting: Family Medicine

## 2023-02-07 ENCOUNTER — Ambulatory Visit: Payer: BC Managed Care – PPO | Admitting: Family Medicine

## 2023-02-07 VITALS — BP 144/102 | HR 75 | Ht 68.0 in | Wt 185.8 lb

## 2023-02-07 DIAGNOSIS — I10 Essential (primary) hypertension: Secondary | ICD-10-CM

## 2023-02-07 DIAGNOSIS — E785 Hyperlipidemia, unspecified: Secondary | ICD-10-CM | POA: Diagnosis not present

## 2023-02-07 DIAGNOSIS — E7849 Other hyperlipidemia: Secondary | ICD-10-CM | POA: Diagnosis not present

## 2023-02-07 MED ORDER — REPATHA 140 MG/ML ~~LOC~~ SOSY: 140.0000 mg | PREFILLED_SYRINGE | SUBCUTANEOUS | 11 refills | Status: DC

## 2023-02-07 NOTE — Assessment & Plan Note (Signed)
BP elevated, repeat today - follow up one month for nurse bp check

## 2023-02-07 NOTE — Progress Notes (Signed)
Established patient visit   Patient: Jesus Baker   DOB: Apr 15, 1976   47 y.o. Male  MRN: [id number] Visit Date: 02/07/2023  Today's healthcare provider: Charlton Amor, DO   Chief Complaint  Patient presents with   Medication Refill    SUBJECTIVE    Chief Complaint  Patient presents with   Medication Refill   HPI   Pt presents for follow up on blood work for repatha. Doing well, no issues on medication.  HTN - on hydrochlorothiazide  - BP slightly elevated today    Review of Systems  Constitutional:  Negative for activity change, fatigue and fever.  Respiratory:  Negative for cough and shortness of breath.   Cardiovascular:  Negative for chest pain.  Gastrointestinal:  Negative for abdominal pain.  Genitourinary:  Negative for difficulty urinating.       Current Meds  Medication Sig   Cholecalciferol (VITAMIN D-3 PO) Take 1 capsule by mouth daily.   Evolocumab (REPATHA) 140 MG/ML SOSY Inject 140 mg into the skin every 14 (fourteen) days.   hydrochlorothiazide (HYDRODIURIL) 25 MG tablet TAKE 1 TABLET (25 MG TOTAL) BY MOUTH DAILY.   MAGNESIUM PO Take by mouth.   modafinil (PROVIGIL) 200 MG tablet Take 0.5-1 tablets (100-200 mg total) by mouth daily as needed.   Multiple Vitamin (MULTIVITAMIN) capsule Take 1 capsule by mouth daily.   Omega-3 Fatty Acids (FISH OIL) 1000 MG CAPS Take 1 capsule by mouth daily.   selenium sulfide (SELSUN) 2.5 % shampoo APPLY 1 APPLICATION TOPICALLY FOR 7 DAYS    OBJECTIVE    BP (!) 144/102 (BP Location: Left Arm, Patient Position: Sitting, Cuff Size: Normal)   Pulse 75   Ht 5\' 8"  (1.727 m)   Wt 185 lb 12 oz (84.3 kg)   SpO2 98%   BMI 28.24 kg/m   Physical Exam Vitals and nursing note reviewed.  Constitutional:      General: He is not in acute distress.    Appearance: Normal appearance.  HENT:     Head: Normocephalic and atraumatic.     Right Ear: External ear normal.     Left Ear: External ear normal.     Nose: Nose  normal.  Eyes:     Conjunctiva/sclera: Conjunctivae normal.  Cardiovascular:     Rate and Rhythm: Normal rate and regular rhythm.  Pulmonary:     Effort: Pulmonary effort is normal.     Breath sounds: Normal breath sounds.  Neurological:     General: No focal deficit present.     Mental Status: He is alert and oriented to person, place, and time.  Psychiatric:        Mood and Affect: Mood normal.        Behavior: Behavior normal.        Thought Content: Thought content normal.        Judgment: Judgment normal.        ASSESSMENT & PLAN    Problem List Items Addressed This Visit       Cardiovascular and Mediastinum   Primary hypertension    BP elevated, repeat today - follow up one month for nurse bp check      Relevant Medications   Evolocumab (REPATHA) 140 MG/ML SOSY     Other   Familial hyperlipidemia    Will order lipid and cmp to check liver enzymes - continue repatha, meds sent in       Relevant Medications   Evolocumab (REPATHA) 140 MG/ML  SOSY   RESOLVED: Hyperlipidemia - Primary   Relevant Medications   Evolocumab (REPATHA) 140 MG/ML SOSY   Other Relevant Orders   Lipid panel   CMP14+EGFR    Return in about 4 weeks (around 03/07/2023) for nurse HTN check.      Meds ordered this encounter  Medications   Evolocumab (REPATHA) 140 MG/ML SOSY    Sig: Inject 140 mg into the skin every 14 (fourteen) days.    Dispense:  1 mL    Refill:  11    Orders Placed This Encounter  Procedures   Lipid panel    Order Specific Question:   Has the patient fasted?    Answer:   No    Order Specific Question:   Release to patient    Answer:   Immediate   CMP14+EGFR    Order Specific Question:   Has the patient fasted?    Answer:   No     Charlton Amor, DO  [facility] Primary Care & Sports Medicine at [facility] [phone] (phone) [phone] (fax)  [facility]

## 2023-02-07 NOTE — Assessment & Plan Note (Signed)
Will order lipid and cmp to check liver enzymes - continue repatha, meds sent in

## 2023-02-22 ENCOUNTER — Ambulatory Visit: Payer: BC Managed Care – PPO | Admitting: Podiatry

## 2023-02-23 ENCOUNTER — Ambulatory Visit: Payer: BC Managed Care – PPO | Admitting: Podiatry

## 2023-02-23 ENCOUNTER — Encounter: Payer: Self-pay | Admitting: Podiatry

## 2023-02-23 DIAGNOSIS — M722 Plantar fascial fibromatosis: Secondary | ICD-10-CM | POA: Diagnosis not present

## 2023-02-23 MED ORDER — DEXAMETHASONE SODIUM PHOSPHATE 120 MG/30ML IJ SOLN
4.0000 mg | Freq: Once | INTRAMUSCULAR | Status: AC
  Administered 2023-02-23: 4 mg via INTRA_ARTICULAR

## 2023-02-23 MED ORDER — TRIAMCINOLONE ACETONIDE 10 MG/ML IJ SUSP
2.5000 mg | Freq: Once | INTRAMUSCULAR | Status: AC
  Administered 2023-02-23: 2.5 mg via INTRA_ARTICULAR

## 2023-02-23 NOTE — Progress Notes (Signed)
  Subjective:  Patient ID: Jesus Baker, male    DOB: November 26, 1975,   MRN: [id number]  No chief complaint on file.   47 y.o. male presents for follow-up of left plantar fasciitis. Relates doing better about 70% but still having some pain that is limiting him. Has been stretching and wearing the brace. Wondering about an injection today.  He is a medical device rep and often in OR.  . Denies any other pedal complaints. Denies n/v/f/c.   Past Medical History:  Diagnosis Date   Complication of anesthesia    Patient reports "bad shakes" after anesthesia   HTN (hypertension)     Objective:  Physical Exam: Vascular: DP/PT pulses 2/4 bilateral. CFT <3 seconds. Normal hair growth on digits. No edema.  Skin. No lacerations or abrasions bilateral feet.  Musculoskeletal: MMT 5/5 bilateral lower extremities in DF, PF, Inversion and Eversion. Deceased ROM in DF of ankle joint. Mildly tender to lateral calcaneal tubercle on the left. No pain along arch, PT or achilles. No pain with calcaneal squeeze.  Neurological: Sensation intact to light touch.   Assessment:   1. Plantar fasciitis, left       Plan:  Patient was evaluated and treated and all questions answered. Discussed plantar fasciitis with patient.  X-rays reviewed and discussed with patient. No acute fractures or dislocations noted. Mild spurring noted at inferior calcaneus.  Discussed treatment options including, ice, NSAIDS, supportive shoes, bracing, and stretching. S Continue stretching and brace.  Injection offered today and procedure below.  Follow-up as needed. Feel this will continue to get better.   Procedure: Injection Tendon/Ligament Discussed alternatives, risks, complications and verbal consent was obtained.  Location: Left plantar fascia . Skin Prep: Alcohol. Injectate: 1cc 0.5% marcaine plain, 1 cc dexamethasone 0.5 cc kenalog  Disposition: Patient tolerated procedure well. Injection site dressed with a band-aid.   Post-injection care was discussed and return precautions discussed.     Louann Sjogren, DPM

## 2023-03-10 LAB — LAB REPORT - SCANNED: EGFR: 64

## 2023-03-12 ENCOUNTER — Encounter: Payer: Self-pay | Admitting: Family Medicine

## 2023-03-13 MED ORDER — REPATHA SURECLICK 140 MG/ML ~~LOC~~ SOAJ: 140.0000 mg | SUBCUTANEOUS | 11 refills | Status: DC

## 2023-04-16 ENCOUNTER — Encounter (INDEPENDENT_AMBULATORY_CARE_PROVIDER_SITE_OTHER): Payer: BC Managed Care – PPO | Admitting: Family Medicine

## 2023-04-16 DIAGNOSIS — I10 Essential (primary) hypertension: Secondary | ICD-10-CM | POA: Diagnosis not present

## 2023-05-07 NOTE — Telephone Encounter (Addendum)
Please see the MyChart message reply(ies) for my assessment and plan.    This patient gave consent for this Medical Advice Message and is aware that it may result in a bill to [employer], as well as the possibility of receiving a bill for a co-payment or deductible. They are an established patient, but are not seeking medical advice exclusively about a problem treated during an in person or video visit in the last seven days. I did not recommend an in person or video visit within seven days of my reply.  I spent 11 total minutes of online digital evaluation and management services in this patient-initiated request for online care.

## 2023-05-08 MED ORDER — AMLODIPINE BESYLATE 5 MG PO TABS: 5.0000 mg | ORAL_TABLET | Freq: Every day | ORAL | 3 refills | Status: DC

## 2023-05-08 NOTE — Addendum Note (Signed)
 Addended by: Monica Becton on: 05/08/2023 02:03 PM   Modules accepted: Orders

## 2023-05-08 NOTE — Assessment & Plan Note (Signed)
 BP elevated, patient feels worse off of testosterone, only on hydrochlorothiazide  monotherapy. He will restart testosterone, he was getting good mental clarity, energy that worsened after discontinuing. We will also switch him from hydrochlorothiazide  to amlodipine  5 mg daily, we can recheck blood pressures in a week or 2. If persistent elevation we can bump up to 10 mg of amlodipine  +/- adding valsartan.

## 2023-07-23 DIAGNOSIS — Z129 Encounter for screening for malignant neoplasm, site unspecified: Secondary | ICD-10-CM | POA: Diagnosis not present

## 2023-07-23 DIAGNOSIS — D2239 Melanocytic nevi of other parts of face: Secondary | ICD-10-CM | POA: Diagnosis not present

## 2023-08-03 ENCOUNTER — Other Ambulatory Visit: Payer: Self-pay | Admitting: Sports Medicine

## 2023-08-03 DIAGNOSIS — I10 Essential (primary) hypertension: Secondary | ICD-10-CM

## 2023-08-21 ENCOUNTER — Telehealth: Payer: Self-pay

## 2023-08-21 ENCOUNTER — Other Ambulatory Visit (HOSPITAL_COMMUNITY): Payer: Self-pay

## 2023-08-21 NOTE — Telephone Encounter (Signed)
 Notified through mychart also LM

## 2023-08-21 NOTE — Telephone Encounter (Signed)
 Pharmacy Patient Advocate Encounter   Received notification from Patient Pharmacy that prior authorization for Repatha Sureclick is required/requested.   Insurance verification completed.   The patient is insured through [facility] .   Per test claim: PA required; PA submitted to above mentioned insurance via CoverMyMeds Key/confirmation #/EOC [facility] Status is pending

## 2023-08-21 NOTE — Telephone Encounter (Signed)
 Pharmacy Patient Advocate Encounter  Received notification from [facility] that Prior Authorization for Repatha Sure Click has been APPROVED from 08/21/23 to 08/20/24. Ran test claim, Copay is $75.00 for a 28 day supply. This test claim was processed through [facility]- copay amounts may vary at other pharmacies due to pharmacy/plan contracts, or as the patient moves through the different stages of their insurance plan.   PA #/Case ID/Reference #: [id number]

## 2023-08-27 ENCOUNTER — Other Ambulatory Visit (HOSPITAL_COMMUNITY): Payer: Self-pay

## 2023-09-05 ENCOUNTER — Other Ambulatory Visit: Payer: Self-pay | Admitting: Sports Medicine

## 2023-09-05 ENCOUNTER — Ambulatory Visit: Admitting: Sports Medicine

## 2023-09-05 ENCOUNTER — Telehealth: Payer: Self-pay

## 2023-09-05 ENCOUNTER — Other Ambulatory Visit (HOSPITAL_COMMUNITY): Payer: Self-pay

## 2023-09-05 ENCOUNTER — Ambulatory Visit

## 2023-09-05 DIAGNOSIS — M25522 Pain in left elbow: Secondary | ICD-10-CM

## 2023-09-05 DIAGNOSIS — S46212A Strain of muscle, fascia and tendon of other parts of biceps, left arm, initial encounter: Secondary | ICD-10-CM

## 2023-09-05 DIAGNOSIS — S46219A Strain of muscle, fascia and tendon of other parts of biceps, unspecified arm, initial encounter: Secondary | ICD-10-CM | POA: Insufficient documentation

## 2023-09-05 MED ORDER — TRIAZOLAM 0.25 MG PO TABS: ORAL_TABLET | ORAL | 0 refills | Status: DC

## 2023-09-05 NOTE — Assessment & Plan Note (Signed)
 Very pleasant 48 year old male, does a lot of weight lifting, he was doing a row and felt a twinge left anterior elbow. Since then he had some pain, weakness. On exam he has palpable crepitus at the distal biceps, he has minimal tenderness at the common extensor tendon origin, minimal reproduction of pain with resisted extension of the middle finger, he does have fairly profound weakness to supination and I am unable to truly hook his distal biceps. Otherwise good motion and good strength. I do have concern for mild lateral epicondylitis but also I think his dominant finding is a distal biceps tear, due to his high demand elbow and concern for tear and desire for aggressive surgical repair if present we will proceed with early MRI Elbow sleeve given, he will see Dr. Yvonne Hering for repair if needed.

## 2023-09-05 NOTE — Progress Notes (Signed)
    Procedures performed today:    None.  Independent interpretation of notes and tests performed by another provider:   None.  Brief History, Exam, Impression, and Recommendations:    Tear of distal tendon of biceps, left Very pleasant 48 year old male, does a lot of weight lifting, he was doing a row and felt a twinge left anterior elbow. Since then he had some pain, weakness. On exam he has palpable crepitus at the distal biceps, he has minimal tenderness at the common extensor tendon origin, minimal reproduction of pain with resisted extension of the middle finger, he does have fairly profound weakness to supination and I am unable to truly hook his distal biceps. Otherwise good motion and good strength. I do have concern for mild lateral epicondylitis but also I think his dominant finding is a distal biceps tear, due to his high demand elbow and concern for tear and desire for aggressive surgical repair if present we will proceed with early MRI Elbow sleeve given, he will see Dr. Yvonne Hering for repair if needed.    ____________________________________________ Joselyn Nicely. Sandy Crumb, M.D., ABFM., CAQSM., AME. Primary Care and Sports Medicine [facility]  Adjunct Professor of [facility] Medicine  [employer]  Restaurant manager, fast food

## 2023-09-05 NOTE — Telephone Encounter (Signed)
 Pharmacy Patient Advocate Encounter   Received notification from Patient Pharmacy that prior authorization for Triazolam  0.25 is required/requested.   Insurance verification completed.   The patient is insured through [facility] .   Per test claim: PA required; PA submitted to above mentioned insurance via CoverMyMeds Key/confirmation #/EOC [id number] Status is pending   Pharmacy Patient Advocate Encounter  Received notification from [facility] that Prior Authorization for Triazolam  0.25 has been DENIED.  No reason given; No denial letter received via Fax or CMM. It has been requested and will be uploaded to the media tab once received. Patient cash price $22.68.  PA #/Case ID/Reference #: [id number]

## 2023-09-09 ENCOUNTER — Ambulatory Visit

## 2023-09-09 DIAGNOSIS — M25522 Pain in left elbow: Secondary | ICD-10-CM

## 2023-09-09 DIAGNOSIS — S46212A Strain of muscle, fascia and tendon of other parts of biceps, left arm, initial encounter: Secondary | ICD-10-CM

## 2023-09-12 ENCOUNTER — Encounter (INDEPENDENT_AMBULATORY_CARE_PROVIDER_SITE_OTHER): Payer: Self-pay | Admitting: Sports Medicine

## 2023-09-12 DIAGNOSIS — S46212A Strain of muscle, fascia and tendon of other parts of biceps, left arm, initial encounter: Secondary | ICD-10-CM

## 2023-09-12 NOTE — Telephone Encounter (Signed)
 Yes please have them move it to stat

## 2023-09-12 NOTE — Telephone Encounter (Signed)

## 2023-09-12 NOTE — Telephone Encounter (Signed)
 MRI of elbow not resulted.  Does this need to be expedited?

## 2023-09-13 ENCOUNTER — Ambulatory Visit: Admitting: Podiatry

## 2023-09-13 ENCOUNTER — Encounter: Payer: Self-pay | Admitting: Family Medicine

## 2023-09-13 DIAGNOSIS — M7752 Other enthesopathy of left foot: Secondary | ICD-10-CM | POA: Diagnosis not present

## 2023-09-13 NOTE — Progress Notes (Signed)
  Subjective:  Patient ID: Jesus Baker, male    DOB: [birth year],   MRN: [id number]  No chief complaint on file.   48 y.o. male presents for new concern of left second toe pain that has been ongoing for a few weeks. Relates mostly pain when walking and bending the toe. States he has not tried any treatments currently. . Denies any other pedal complaints. Denies n/v/f/c.   Past Medical History:  Diagnosis Date   Complication of anesthesia    Patient reports "bad shakes" after anesthesia   HTN (hypertension)     Objective:  Physical Exam: Vascular: DP/PT pulses 2/4 bilateral. CFT <3 seconds. Normal hair growth on digits. No edema.  Skin. No lacerations or abrasions bilateral feet.  Musculoskeletal: MMT 5/5 bilateral lower extremities in DF, PF, Inversion and Eversion. Deceased ROM in DF of ankle joint. Tender to platnar second metatarsophalangeal joint and pain with DF of the joint. Mild tenderness to interspaces not pain elsewhere.  Neurological: Sensation intact to light touch.   Assessment:   1. Capsulitis of toe of left foot        Plan:  Patient was evaluated and treated and all questions answered. Discussed capsulitis and inflammation of joint and treatment options with patient.  Radiographs reviewed and discussed with patient.  Injection offered today. Patient deferred Discussed stiff sole shoes and recommend use of metatarsal pad. .  Discussed taping and demonstrated taping of the toe.  Discussed if pain does not improve may consider PT and/or MRI for further surgical planning.  Patient to return in 6 weeks or sooner if concerns arise.     Jennefer Moats, DPM

## 2023-09-14 ENCOUNTER — Ambulatory Visit: Admitting: Podiatry

## 2023-09-14 ENCOUNTER — Encounter: Payer: Self-pay | Admitting: Podiatry

## 2023-09-15 ENCOUNTER — Encounter: Payer: Self-pay | Admitting: Sports Medicine

## 2023-09-17 ENCOUNTER — Other Ambulatory Visit: Payer: Self-pay | Admitting: Podiatry

## 2023-09-17 MED ORDER — MELOXICAM 15 MG PO TABS: 15.0000 mg | ORAL_TABLET | Freq: Every day | ORAL | 0 refills | Status: DC

## 2023-09-17 MED ORDER — MELOXICAM 15 MG PO TABS: ORAL_TABLET | ORAL | 3 refills | Status: DC

## 2023-09-17 NOTE — Telephone Encounter (Signed)
 See other MyChart message

## 2023-09-17 NOTE — Addendum Note (Signed)
 Addended by: Gean Keels on: 09/17/2023 09:20 AM   Modules accepted: Orders

## 2023-09-18 ENCOUNTER — Ambulatory Visit: Payer: Self-pay | Admitting: Sports Medicine

## 2023-09-18 ENCOUNTER — Encounter: Payer: Self-pay | Admitting: Sports Medicine

## 2023-09-18 NOTE — Telephone Encounter (Signed)
 Spoke with radiology reading room - MRI elbow changed to STAT read.

## 2023-09-18 NOTE — Telephone Encounter (Signed)
 Yes please have them mark it stat!

## 2023-11-02 LAB — LAB REPORT - SCANNED: EGFR: 67

## 2023-11-05 ENCOUNTER — Encounter: Payer: Self-pay | Admitting: Sports Medicine

## 2023-11-05 DIAGNOSIS — I10 Essential (primary) hypertension: Secondary | ICD-10-CM

## 2023-11-05 MED ORDER — AMLODIPINE BESYLATE 5 MG PO TABS: 5.0000 mg | ORAL_TABLET | Freq: Every day | ORAL | 0 refills | Status: DC

## 2023-11-05 NOTE — Telephone Encounter (Signed)
 I think we were told by the office manager that we would no longer be providing coverage for Dr. Bevin.  Patients have had enough time to reestablish.

## 2023-12-06 ENCOUNTER — Other Ambulatory Visit: Payer: Self-pay | Admitting: Family Medicine

## 2023-12-06 DIAGNOSIS — I10 Essential (primary) hypertension: Secondary | ICD-10-CM

## 2023-12-06 NOTE — Telephone Encounter (Signed)
 Pls contact pt to schedule appt with a new PCP. Unable to refill meds.

## 2023-12-07 ENCOUNTER — Encounter: Payer: Self-pay | Admitting: Urgent Care

## 2023-12-07 ENCOUNTER — Ambulatory Visit: Admitting: Urgent Care

## 2023-12-07 VITALS — BP 136/90 | HR 67 | Ht 68.0 in | Wt 187.0 lb

## 2023-12-07 DIAGNOSIS — E7849 Other hyperlipidemia: Secondary | ICD-10-CM

## 2023-12-07 DIAGNOSIS — I10 Essential (primary) hypertension: Secondary | ICD-10-CM

## 2023-12-07 DIAGNOSIS — R7989 Other specified abnormal findings of blood chemistry: Secondary | ICD-10-CM

## 2023-12-07 DIAGNOSIS — S59909A Unspecified injury of unspecified elbow, initial encounter: Secondary | ICD-10-CM

## 2023-12-07 DIAGNOSIS — D582 Other hemoglobinopathies: Secondary | ICD-10-CM

## 2023-12-07 DIAGNOSIS — Z79890 Hormone replacement therapy (postmenopausal): Secondary | ICD-10-CM

## 2023-12-07 MED ORDER — REPATHA SURECLICK 140 MG/ML ~~LOC~~ SOAJ: 140.0000 mg | SUBCUTANEOUS | 3 refills | Status: AC

## 2023-12-07 MED ORDER — AMLODIPINE BESYLATE 5 MG PO TABS: 5.0000 mg | ORAL_TABLET | Freq: Every day | ORAL | 3 refills | Status: AC

## 2023-12-07 NOTE — Progress Notes (Signed)
 Established Patient Office Visit  Subjective:  Patient ID: Jesus Baker, male    DOB: [birth year]  Age: 48 y.o. MRN: [id number]  Chief Complaint  Patient presents with   Hypertension    HPI  Discussed the use of AI scribe software for clinical note transcription with the patient, who gave verbal consent to proceed.  History of Present Illness   Jesus Baker is a 48 year old male with hypertension and hyperlipidemia who presents for medication management and lab review.  He has elevated hemoglobin levels. He donated blood three weeks ago. He has been on testosterone injections twice weekly, and his recent testosterone level was slightly high at 900, leading to a dose reduction. He has a history of feeling 'foggy' and stressed when his testosterone levels were low, and reported feeling much better after starting therapy.  He is currently taking amlodipine  5 mg daily for hypertension and reports consistent use. He also takes vitamin D , which is stronger than over-the-counter options, and Repatha , although he sometimes forgets doses, estimating he takes it about 75% of the time. He does not take statins due to personal preference and reports that his cholesterol levels have improved.  He occasionally uses Provigil  for work shift-related fatigue, approximately once a month, and has not used meloxicam  recently. He also takes zinc and selenium  sulfide shampoo was used in the past but not currently.  He follows a holistic health approach with a provider in [place], and reports that this provider checks his PSA, cholesterol, hemoglobin, and testosterone levels. He has uploaded lab results to MyChart but acknowledges they may not have been reviewed by his current provider. His last lab work was done three to four months ago, and he plans to upload the results again. He maintains an active lifestyle, working out regularly, and has a history of elevated creatinine levels. No current concerns or complaints.        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   Past Medical History:  Diagnosis Date   Complication of anesthesia    Patient reports bad shakes after anesthesia   HTN (hypertension)    Past Surgical History:  Procedure Laterality Date   KNEE ARTHROSCOPY     KNEE ARTHROSCOPY W/ ACL RECONSTRUCTION     SHOULDER ARTHROSCOPY WITH ROTATOR CUFF REPAIR AND SUBACROMIAL DECOMPRESSION Right 11/17/2021   Procedure: SHOULDER ARTHROSCOPY WITH ROTATOR CUFF REPAIR, SUBACROMIAL DECOMPRESSION AND BICEPS TENODESIS;  Surgeon: Cristy Bonner DASEN, MD;  Location: [facility];  Service: Orthopedics;  Laterality: Right;   Social History   Tobacco Use   Smoking status: Never   Smokeless tobacco: Never  Substance Use Topics   Alcohol use: Yes    Alcohol/week: 3.0 standard drinks of alcohol    Types: 3 Standard drinks or equivalent per week   Drug use: No      ROS: as noted in HPI  Objective:     BP (!) 136/90   Pulse 67   Ht 5' 8 (  1.727 m)   Wt 187 lb (84.8 kg)   SpO2 98%   BMI 28.43 kg/m  BP Readings from Last 3 Encounters:  12/07/23 (!) 136/90  02/07/23 (!) 144/102  07/17/22 (!) 140/71   Wt Readings from Last 3 Encounters:  12/07/23 187 lb (84.8 kg)  02/07/23 185 lb 12 oz (84.3 kg)  07/17/22 180 lb 1.9 oz (81.7 kg)      Physical Exam Vitals and nursing note reviewed.  Constitutional:      General: He is not in acute  distress.    Appearance: Normal appearance. He is not ill-appearing, toxic-appearing or diaphoretic.  HENT:     Head: Normocephalic and atraumatic.     Right Ear: Tympanic membrane, ear canal and external ear normal. There is no impacted cerumen.     Left Ear: Tympanic membrane, ear canal and external ear normal. There is no impacted cerumen.     Nose: Nose normal.     Mouth/Throat:     Mouth: Mucous membranes are moist.     Pharynx: Oropharynx is clear. No oropharyngeal exudate or posterior oropharyngeal erythema.  Eyes:     General: No scleral icterus.       Right eye: No discharge.        Left eye: No discharge.     Extraocular Movements: Extraocular movements intact.     Pupils: Pupils are equal, round, and reactive to light.  Neck:     Thyroid: No thyroid mass, thyromegaly or thyroid tenderness.  Cardiovascular:     Rate and Rhythm: Normal rate and regular rhythm.     Pulses: Normal pulses.     Heart sounds: No murmur heard. Pulmonary:     Effort: Pulmonary effort is normal. No respiratory distress.     Breath sounds: Normal breath sounds. No stridor. No wheezing or rhonchi.  Abdominal:     General: Abdomen is flat. Bowel sounds are normal. There is no distension.     Palpations: Abdomen is soft. There is no mass.     Tenderness: There is no abdominal tenderness. There is no guarding.  Musculoskeletal:     Cervical back: Normal range of motion and neck supple. No rigidity or tenderness.     Right lower leg: No edema.     Left lower leg: No edema.  Lymphadenopathy:     Cervical: No cervical adenopathy.  Skin:    General: Skin is warm and dry.     Coloration: Skin is not jaundiced.     Findings: No bruising, erythema or rash.  Neurological:     General: No focal deficit present.     Mental Status: He is alert and oriented to person, place, and time.     Sensory: No sensory deficit.     Motor: No weakness.  Psychiatric:        Mood and Affect: Mood normal.         Behavior: Behavior normal.      No results found for any visits on 12/07/23.  Last CBC Lab Results  Component Value Date   WBC 4.1 10/29/2019   HGB 17.9 (H) 10/29/2019   HCT 52.1 (H) 10/29/2019   MCV 92.0 10/29/2019   MCH 31.6 10/29/2019   RDW 12.3 10/29/2019   PLT 164 10/29/2019   Last metabolic panel Lab Results  Component Value Date   GLUCOSE 64 (L) 11/15/2021   NA 138 11/15/2021   K 4.5 11/15/2021   CL 101 11/15/2021   CO2 28 11/15/2021  BUN 18 11/15/2021   CREATININE 1.31 (H) 11/15/2021   GFRNONAA >60 11/15/2021   CALCIUM 9.5 11/15/2021   PROT 7.8 10/29/2019   ALBUMIN 4.6 06/21/2016   BILITOT 0.7 10/29/2019   ALKPHOS 62 06/21/2016   AST 20 10/29/2019   ALT 22 10/29/2019   ANIONGAP 9 11/15/2021   Last lipids Lab Results  Component Value Date   CHOL 262 (H) 10/29/2019   HDL 54 10/29/2019   LDLCALC 191 (H) 10/29/2019   TRIG 66 10/29/2019   CHOLHDL 4.9 (H) 10/29/2019   Last hemoglobin A1c No results found for: HGBA1C Last thyroid functions Lab Results  Component Value Date   TSH 4.44 10/29/2019   Last vitamin D  Lab Results  Component Value Date   VD25OH 53 06/21/2016   Last vitamin B12 and Folate No results found for: VITAMINB12, FOLATE    The ASCVD Risk score (Arnett DK, et al., 2019) failed to calculate for the following reasons:   Cannot find a previous HDL lab   Cannot find a previous total cholesterol lab  Assessment & Plan:  Primary hypertension -     amLODIPine  Besylate; Take 1 tablet (5 mg total) by mouth daily. WOULD LIKE APPOINTMENT FOR FURTHER REFILLS  Dispense: 90 tablet; Refill: 3  Familial hyperlipidemia -     Repatha  SureClick; Inject 140 mg into the skin every 14 (fourteen) days.  Dispense: 6 mL; Refill: 3  Primary hypertension [I10] -     amLODIPine  Besylate; Take 1 tablet (5 mg total) by mouth daily. WOULD LIKE APPOINTMENT FOR FURTHER REFILLS  Dispense: 90 tablet; Refill: 3  Elevated serum  creatinine  Hemoglobinopathy (HCC)  Long-term current use of testosterone replacement therapy  Assessment and Plan    Hypertension Managed with amlodipine  5 mg daily. Home readings slightly lower than in-office. Healthy lifestyle maintained. Provider rechecked BP and noted it to be 120/80 - Continue amlodipine  5 mg daily. - Provide a one-year supply of amlodipine . - Monitor blood pressure at home.  Hyperlipidemia on PCSK9 inhibitor therapy Managed with Repatha . LDL within desired range. Occasional missed doses due to supply issues. - Call in a three-month supply of Repatha  (6 mL). - Ensure Repatha  availability at pharmacy. - Monitor cholesterol levels.  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 Hemoglobin slightly elevated. Blood donation improves levels. Dose previously reduced due to high testosterone. - Upload recent lab results to MyChart. - Monitor hemoglobin and testosterone levels. - Schedule next lab draw six weeks post-blood donation.  Elevated creatinine likely due to increased muscle mass Slightly elevated creatinine likely from increased muscle mass. Other renal markers normal. - Continue monitoring creatinine levels. - Consider timing of blood draws post-exercise to avoid elevated readings.         Return in about 6 months (around 06/08/2024) for Annual Physical.   Benton LITTIE Gave, PA

## 2023-12-08 ENCOUNTER — Encounter: Payer: Self-pay | Admitting: Urgent Care

## 2023-12-08 NOTE — Patient Instructions (Signed)
 Great to meet you. Please intermittently monitor your blood pressure at home.  Continue all medications as currently prescribed. Upload lab results to Mychart for our review.  Return in 6 months for annual PE and fasting labs.

## 2023-12-10 NOTE — Telephone Encounter (Signed)
 Could you please add his supplement list to his active medication list please? Thanks

## 2024-01-08 ENCOUNTER — Encounter: Payer: Self-pay | Admitting: Sports Medicine

## 2024-01-15 DIAGNOSIS — M25522 Pain in left elbow: Secondary | ICD-10-CM | POA: Diagnosis not present

## 2024-02-05 DIAGNOSIS — M67431 Ganglion, right wrist: Secondary | ICD-10-CM | POA: Diagnosis not present

## 2024-02-15 DIAGNOSIS — M67431 Ganglion, right wrist: Secondary | ICD-10-CM | POA: Diagnosis not present

## 2024-02-15 DIAGNOSIS — M7712 Lateral epicondylitis, left elbow: Secondary | ICD-10-CM | POA: Diagnosis not present

## 2024-02-15 DIAGNOSIS — M19031 Primary osteoarthritis, right wrist: Secondary | ICD-10-CM | POA: Diagnosis not present

## 2024-05-13 ENCOUNTER — Encounter (INDEPENDENT_AMBULATORY_CARE_PROVIDER_SITE_OTHER): Payer: Self-pay | Admitting: Urgent Care

## 2024-05-13 DIAGNOSIS — A048 Other specified bacterial intestinal infections: Secondary | ICD-10-CM | POA: Diagnosis not present

## 2024-05-13 NOTE — Telephone Encounter (Signed)

## 2024-05-28 ENCOUNTER — Ambulatory Visit: Payer: Self-pay | Admitting: Urgent Care

## 2024-06-13 ENCOUNTER — Ambulatory Visit: Admitting: Urgent Care

## 2024-06-13 VITALS — BP 138/88 | HR 74 | Ht 68.0 in | Wt 189.0 lb

## 2024-06-13 DIAGNOSIS — D582 Other hemoglobinopathies: Secondary | ICD-10-CM

## 2024-06-13 DIAGNOSIS — Z0000 Encounter for general adult medical examination without abnormal findings: Secondary | ICD-10-CM

## 2024-06-13 DIAGNOSIS — R946 Abnormal results of thyroid function studies: Secondary | ICD-10-CM

## 2024-06-13 DIAGNOSIS — E785 Hyperlipidemia, unspecified: Secondary | ICD-10-CM

## 2024-06-13 DIAGNOSIS — Z79890 Hormone replacement therapy (postmenopausal): Secondary | ICD-10-CM

## 2024-06-13 DIAGNOSIS — I10 Essential (primary) hypertension: Secondary | ICD-10-CM

## 2024-06-13 NOTE — Progress Notes (Unsigned)
 "  Annual Wellness Visit     Patient: Jesus Baker, Male    DOB: 04-25-1976, 49 y.o.   MRN: [id number]  Subjective  Chief Complaint  Patient presents with   Annual Exam    BP at home running in 150s    Jesus Baker is a 49 y.o. male who presents today for his Annual Wellness Visit. He reports consuming a {diet types:17450} diet. {Exercise:19826} He generally feels {well/fairly well/poorly:18703}. He reports sleeping {well/fairly well/poorly:18703}. He {does/does not:200015} have additional problems to discuss today.   HPI  {VISON DENTAL STD PSA (Optional):27386}   {History (Optional):23778}  Medications: Show/hide medication list[1]  Allergies[2]  Patient Care Team: Lowella Benton CROME, [place] as PCP - General (Physician Assistant)  ROS      Objective  BP (!) 160/106   Pulse 74   Ht 5' 8 (1.727 m)   Wt 189 lb (85.7 kg)   SpO2 98%   BMI 28.74 kg/m  {Vitals History (Optional):23777}  Physical Exam  Most recent depression screenings:    06/13/2024    7:44 AM 12/07/2023    7:42 AM  PHQ 2/9 Scores  PHQ - 2 Score 0 0  PHQ- 9 Score 0     Fall Screening Falls in the past year?: 0 Number of falls in past year: 0 Was there an injury with Fall?: 0 Fall Risk Category Calculator: 0 Patient Fall Risk Level: Low Fall Risk  Fall Risk Patient at Risk for Falls Due to: No Fall Risks Fall risk Follow up: Falls evaluation completed    Vision/Hearing Screen: No results found.  {Labs (Optional):23779}  No results found for any visits on 06/13/24.    Assessment & Plan   Annual wellness visit done today including the all of the following: Reviewed patient's Family and Medical History Reviewed and updated list of patient's medical providers Assessment of cognitive impairment was done Assessed patient's functional ability Established a written schedule for health screening services Health Risk Assessent Completed and Reviewed  Exercise Activities and Dietary  recommendations  Goals   None     Immunization History  Administered Date(s) Administered   Hepatitis A 07/07/1999   Hepatitis B 07/07/1999   Influenza,inj,Quad PF,6+ Mos 12/24/2015, 12/28/2017, 01/07/2020   Influenza-Unspecified 07/07/2015, 01/23/2017, 12/21/2018, 01/17/2019, 01/14/2021   MMR 10/06/1993   PFIZER(Purple Top)SARS-COV-2 Vaccination 06/20/2019, 07/11/2019, 04/03/2020   PPD Test 10/19/2015, 10/18/2016, 10/24/2017   Tdap 07/07/2015   Typhoid Live 04/03/2018   Varicella 12/06/1988    Health Maintenance  Topic Date Due   Hepatitis B Vaccines 19-59 Average Risk (2 of 3 - 19+ 3-dose series) 08/04/1999   COVID-19 Vaccine (4 - 2025-26 season) 01/07/2024   Influenza Vaccine  08/05/2024 (Originally 12/07/2023)   DTaP/Tdap/Td (2 - Td or Tdap) 07/06/2025   Colonoscopy  04/09/2031   HPV VACCINES (No Doses Required) Completed   Hepatitis C Screening  Completed   HIV Screening  Completed   Pneumococcal Vaccine  Aged Out   Meningococcal B Vaccine  Aged Out     Discussed health benefits of physical activity, and encouraged him to engage in regular exercise appropriate for his age and condition.    Problem List Items Addressed This Visit   None   No follow-ups on file.     Benton CROME Lowella, PA       [1]  Outpatient Medications Prior to Visit  Medication Sig   AMBULATORY NON FORMULARY MEDICATION Medication Name: methyl guard plus, Methylated B6, methylated folate, B12   AMBULATORY NON  FORMULARY MEDICATION Medication Name: liver/kidney nutrients ( mushroom based: codiceps Agaricus, Astragulus)   amLODipine  (NORVASC ) 5 MG tablet Take 1 tablet (5 mg total) by mouth daily. WOULD LIKE APPOINTMENT FOR FURTHER REFILLS   Cholecalciferol (VITAMIN D -3 PO) Take 1 capsule by mouth daily.   COPPER PO Take by mouth.   Evolocumab  (REPATHA  SURECLICK) 140 MG/ML SOAJ Inject 140 mg into the skin every 14 (fourteen) days.   MAGNESIUM PO Take by mouth.   modafinil  (PROVIGIL ) 200 MG  tablet Take 0.5-1 tablets (100-200 mg total) by mouth daily as needed.   Multiple Vitamin (MULTIVITAMIN) capsule Take 1 capsule by mouth daily.   Omega-3 Fatty Acids (FISH OIL) 1000 MG CAPS Take 1 capsule by mouth daily.   testosterone cypionate (DEPOTESTOSTERONE CYPIONATE) 200 MG/ML injection Inject into the muscle every 14 (fourteen) days.   No facility-administered medications prior to visit.  [2]  Allergies Allergen Reactions   Benadryl [Diphenhydramine]     Patient states not an allergy, but medication usually has opposite effect for him   Poison Ivy Extract [Poison Ivy Extract]    Poison Oak Extract [Poison Oak Extract]    [place]    "

## 2024-06-13 NOTE — Patient Instructions (Signed)
 Please upload your lab results for review.  Monitor BP at home, goal 120/80
